# Patient Record
Sex: Male | Born: 1955 | Race: White | Hispanic: No | Marital: Married | Smoking: Never smoker
Health system: Southern US, Community
[De-identification: ages and names within clinical notes are randomized; demographics above are authoritative.]

## PROBLEM LIST (undated history)

## (undated) DIAGNOSIS — T7840XA Allergy, unspecified, initial encounter: Secondary | ICD-10-CM

## (undated) DIAGNOSIS — E119 Type 2 diabetes mellitus without complications: Secondary | ICD-10-CM

## (undated) DIAGNOSIS — K219 Gastro-esophageal reflux disease without esophagitis: Secondary | ICD-10-CM

## (undated) DIAGNOSIS — I4891 Unspecified atrial fibrillation: Secondary | ICD-10-CM

## (undated) DIAGNOSIS — I639 Cerebral infarction, unspecified: Secondary | ICD-10-CM

## (undated) HISTORY — DX: Allergy, unspecified, initial encounter: T78.40XA

## (undated) HISTORY — PX: KNEE SURGERY: SHX244

## (undated) HISTORY — PX: HAND SURGERY: SHX662

---

## 2019-12-07 ENCOUNTER — Emergency Department (HOSPITAL_COMMUNITY): Payer: No Typology Code available for payment source

## 2019-12-07 ENCOUNTER — Encounter (HOSPITAL_COMMUNITY): Payer: Self-pay

## 2019-12-07 ENCOUNTER — Inpatient Hospital Stay (HOSPITAL_COMMUNITY)
Admission: EM | Admit: 2019-12-07 | Discharge: 2019-12-16 | DRG: 459 | Disposition: A | Discharge status: Home Health | Payer: No Typology Code available for payment source | Attending: General Surgery | Admitting: General Surgery

## 2019-12-07 ENCOUNTER — Other Ambulatory Visit: Payer: Self-pay

## 2019-12-07 DIAGNOSIS — S42109A Fracture of unspecified part of scapula, unspecified shoulder, initial encounter for closed fracture: Secondary | ICD-10-CM | POA: Diagnosis present

## 2019-12-07 DIAGNOSIS — I513 Intracardiac thrombosis, not elsewhere classified: Secondary | ICD-10-CM | POA: Diagnosis present

## 2019-12-07 DIAGNOSIS — I712 Thoracic aortic aneurysm, without rupture, unspecified: Secondary | ICD-10-CM

## 2019-12-07 DIAGNOSIS — Z419 Encounter for procedure for purposes other than remedying health state, unspecified: Secondary | ICD-10-CM

## 2019-12-07 DIAGNOSIS — Z4659 Encounter for fitting and adjustment of other gastrointestinal appliance and device: Secondary | ICD-10-CM

## 2019-12-07 DIAGNOSIS — G4733 Obstructive sleep apnea (adult) (pediatric): Secondary | ICD-10-CM | POA: Diagnosis present

## 2019-12-07 DIAGNOSIS — S22069A Unspecified fracture of T7-T8 vertebra, initial encounter for closed fracture: Secondary | ICD-10-CM | POA: Diagnosis present

## 2019-12-07 DIAGNOSIS — Z79899 Other long term (current) drug therapy: Secondary | ICD-10-CM | POA: Diagnosis not present

## 2019-12-07 DIAGNOSIS — J90 Pleural effusion, not elsewhere classified: Secondary | ICD-10-CM | POA: Diagnosis present

## 2019-12-07 DIAGNOSIS — Z20822 Contact with and (suspected) exposure to covid-19: Secondary | ICD-10-CM | POA: Diagnosis present

## 2019-12-07 DIAGNOSIS — Y92410 Unspecified street and highway as the place of occurrence of the external cause: Secondary | ICD-10-CM

## 2019-12-07 DIAGNOSIS — S2241XA Multiple fractures of ribs, right side, initial encounter for closed fracture: Secondary | ICD-10-CM | POA: Diagnosis present

## 2019-12-07 DIAGNOSIS — R52 Pain, unspecified: Secondary | ICD-10-CM

## 2019-12-07 DIAGNOSIS — Z8673 Personal history of transient ischemic attack (TIA), and cerebral infarction without residual deficits: Secondary | ICD-10-CM | POA: Diagnosis not present

## 2019-12-07 DIAGNOSIS — I482 Chronic atrial fibrillation, unspecified: Secondary | ICD-10-CM | POA: Diagnosis present

## 2019-12-07 DIAGNOSIS — S27321A Contusion of lung, unilateral, initial encounter: Secondary | ICD-10-CM | POA: Diagnosis present

## 2019-12-07 DIAGNOSIS — I10 Essential (primary) hypertension: Secondary | ICD-10-CM | POA: Diagnosis present

## 2019-12-07 DIAGNOSIS — J9601 Acute respiratory failure with hypoxia: Secondary | ICD-10-CM | POA: Diagnosis present

## 2019-12-07 DIAGNOSIS — S27321D Contusion of lung, unilateral, subsequent encounter: Secondary | ICD-10-CM | POA: Diagnosis not present

## 2019-12-07 DIAGNOSIS — S22059A Unspecified fracture of T5-T6 vertebra, initial encounter for closed fracture: Principal | ICD-10-CM | POA: Diagnosis present

## 2019-12-07 DIAGNOSIS — S0990XA Unspecified injury of head, initial encounter: Secondary | ICD-10-CM

## 2019-12-07 DIAGNOSIS — K219 Gastro-esophageal reflux disease without esophagitis: Secondary | ICD-10-CM | POA: Diagnosis present

## 2019-12-07 DIAGNOSIS — S272XXA Traumatic hemopneumothorax, initial encounter: Secondary | ICD-10-CM

## 2019-12-07 DIAGNOSIS — R000 Tachycardia, unspecified: Secondary | ICD-10-CM | POA: Diagnosis not present

## 2019-12-07 DIAGNOSIS — Z7984 Long term (current) use of oral hypoglycemic drugs: Secondary | ICD-10-CM

## 2019-12-07 DIAGNOSIS — S8001XA Contusion of right knee, initial encounter: Secondary | ICD-10-CM | POA: Diagnosis present

## 2019-12-07 DIAGNOSIS — S42111A Displaced fracture of body of scapula, right shoulder, initial encounter for closed fracture: Secondary | ICD-10-CM | POA: Diagnosis present

## 2019-12-07 DIAGNOSIS — S42101A Fracture of unspecified part of scapula, right shoulder, initial encounter for closed fracture: Secondary | ICD-10-CM | POA: Diagnosis not present

## 2019-12-07 DIAGNOSIS — E1165 Type 2 diabetes mellitus with hyperglycemia: Secondary | ICD-10-CM | POA: Diagnosis present

## 2019-12-07 DIAGNOSIS — S270XXA Traumatic pneumothorax, initial encounter: Secondary | ICD-10-CM | POA: Diagnosis present

## 2019-12-07 DIAGNOSIS — K567 Ileus, unspecified: Secondary | ICD-10-CM | POA: Diagnosis not present

## 2019-12-07 DIAGNOSIS — D62 Acute posthemorrhagic anemia: Secondary | ICD-10-CM | POA: Diagnosis not present

## 2019-12-07 DIAGNOSIS — S161XXA Strain of muscle, fascia and tendon at neck level, initial encounter: Secondary | ICD-10-CM

## 2019-12-07 DIAGNOSIS — E1151 Type 2 diabetes mellitus with diabetic peripheral angiopathy without gangrene: Secondary | ICD-10-CM | POA: Diagnosis present

## 2019-12-07 DIAGNOSIS — Z7901 Long term (current) use of anticoagulants: Secondary | ICD-10-CM

## 2019-12-07 DIAGNOSIS — M25511 Pain in right shoulder: Secondary | ICD-10-CM | POA: Diagnosis not present

## 2019-12-07 DIAGNOSIS — J9690 Respiratory failure, unspecified, unspecified whether with hypoxia or hypercapnia: Secondary | ICD-10-CM

## 2019-12-07 DIAGNOSIS — I4891 Unspecified atrial fibrillation: Secondary | ICD-10-CM

## 2019-12-07 DIAGNOSIS — I779 Disorder of arteries and arterioles, unspecified: Secondary | ICD-10-CM

## 2019-12-07 DIAGNOSIS — R079 Chest pain, unspecified: Secondary | ICD-10-CM | POA: Diagnosis not present

## 2019-12-07 HISTORY — DX: Unspecified atrial fibrillation: I48.91

## 2019-12-07 HISTORY — DX: Cerebral infarction, unspecified: I63.9

## 2019-12-07 HISTORY — DX: Allergy, unspecified, initial encounter: T78.40XA

## 2019-12-07 HISTORY — DX: Gastro-esophageal reflux disease without esophagitis: K21.9

## 2019-12-07 HISTORY — DX: Type 2 diabetes mellitus without complications: E11.9

## 2019-12-07 LAB — CBC WITH DIFFERENTIAL/PLATELET
Abs Immature Granulocytes: 0.29 10*3/uL — ABNORMAL HIGH (ref 0.00–0.07)
Basophils Absolute: 0 10*3/uL (ref 0.0–0.1)
Basophils Relative: 0 %
Eosinophils Absolute: 0.1 10*3/uL (ref 0.0–0.5)
Eosinophils Relative: 1 %
HCT: 39.9 % (ref 39.0–52.0)
Hemoglobin: 12.8 g/dL — ABNORMAL LOW (ref 13.0–17.0)
Immature Granulocytes: 2 %
Lymphocytes Relative: 18 %
Lymphs Abs: 3.1 10*3/uL (ref 0.7–4.0)
MCH: 29.6 pg (ref 26.0–34.0)
MCHC: 32.1 g/dL (ref 30.0–36.0)
MCV: 92.1 fL (ref 80.0–100.0)
Monocytes Absolute: 1 10*3/uL (ref 0.1–1.0)
Monocytes Relative: 5 %
Neutro Abs: 13.1 10*3/uL — ABNORMAL HIGH (ref 1.7–7.7)
Neutrophils Relative %: 74 %
Platelets: 333 10*3/uL (ref 150–400)
RBC: 4.33 MIL/uL (ref 4.22–5.81)
RDW: 14.5 % (ref 11.5–15.5)
WBC: 17.6 10*3/uL — ABNORMAL HIGH (ref 4.0–10.5)
nRBC: 0 % (ref 0.0–0.2)

## 2019-12-07 LAB — COMPREHENSIVE METABOLIC PANEL
ALT: 65 U/L — ABNORMAL HIGH (ref 0–44)
AST: 48 U/L — ABNORMAL HIGH (ref 15–41)
Albumin: 3.6 g/dL (ref 3.5–5.0)
Alkaline Phosphatase: 80 U/L (ref 38–126)
Anion gap: 11 (ref 5–15)
BUN: 21 mg/dL (ref 8–23)
CO2: 23 mmol/L (ref 22–32)
Calcium: 9 mg/dL (ref 8.9–10.3)
Chloride: 104 mmol/L (ref 98–111)
Creatinine, Ser: 1.06 mg/dL (ref 0.61–1.24)
GFR calc Af Amer: >60 mL/min (ref >60)
GFR calc non Af Amer: >60 mL/min (ref >60)
Glucose, Bld: 196 mg/dL — ABNORMAL HIGH (ref 70–99)
Potassium: 4 mmol/L (ref 3.5–5.1)
Sodium: 138 mmol/L (ref 135–145)
Total Bilirubin: 1.2 mg/dL (ref 0.3–1.2)
Total Protein: 6.8 g/dL (ref 6.5–8.1)

## 2019-12-07 LAB — ABO/RH: ABO/RH(D): O POS

## 2019-12-07 LAB — I-STAT CHEM 8, ED
BUN: 23 mg/dL (ref 8–23)
Calcium, Ion: 1.07 mmol/L — ABNORMAL LOW (ref 1.15–1.40)
Chloride: 103 mmol/L (ref 98–111)
Creatinine, Ser: 1 mg/dL (ref 0.61–1.24)
Glucose, Bld: 190 mg/dL — ABNORMAL HIGH (ref 70–99)
HCT: 40 % (ref 39.0–52.0)
Hemoglobin: 13.6 g/dL (ref 13.0–17.0)
Potassium: 4 mmol/L (ref 3.5–5.1)
Sodium: 141 mmol/L (ref 135–145)
TCO2: 24 mmol/L (ref 22–32)

## 2019-12-07 LAB — GLUCOSE, CAPILLARY: Glucose-Capillary: 203 mg/dL — ABNORMAL HIGH (ref 70–99)

## 2019-12-07 LAB — TYPE AND SCREEN
ABO/RH(D): O POS
Antibody Screen: NEGATIVE

## 2019-12-07 LAB — HIV ANTIBODY (ROUTINE TESTING W REFLEX): HIV Screen 4th Generation wRfx: NONREACTIVE

## 2019-12-07 LAB — MRSA PCR SCREENING: MRSA by PCR: NEGATIVE

## 2019-12-07 MED ORDER — INSULIN ASPART 100 UNIT/ML ~~LOC~~ SOLN
0.0000 [IU] | Freq: Every day | SUBCUTANEOUS | Status: DC
  Administered 2019-12-07: 2 [IU] via SUBCUTANEOUS

## 2019-12-07 MED ORDER — ONDANSETRON HCL 4 MG/2ML IJ SOLN
4.0000 mg | Freq: Once | INTRAMUSCULAR | Status: AC
  Administered 2019-12-07: 4 mg via INTRAVENOUS
  Filled 2019-12-07: qty 2

## 2019-12-07 MED ORDER — ENOXAPARIN SODIUM 40 MG/0.4ML ~~LOC~~ SOLN: 40.0000 mg | Freq: Two times a day (BID) | SUBCUTANEOUS | Status: DC

## 2019-12-07 MED ORDER — INSULIN ASPART 100 UNIT/ML ~~LOC~~ SOLN
0.0000 [IU] | Freq: Three times a day (TID) | SUBCUTANEOUS | Status: DC
  Administered 2019-12-08 (×3): 2 [IU] via SUBCUTANEOUS
  Administered 2019-12-09: 3 [IU] via SUBCUTANEOUS
  Administered 2019-12-09 – 2019-12-13 (×8): 2 [IU] via SUBCUTANEOUS
  Administered 2019-12-13: 3 [IU] via SUBCUTANEOUS
  Administered 2019-12-13 – 2019-12-14 (×2): 2 [IU] via SUBCUTANEOUS
  Administered 2019-12-14: 3 [IU] via SUBCUTANEOUS
  Administered 2019-12-14 – 2019-12-16 (×6): 2 [IU] via SUBCUTANEOUS

## 2019-12-07 MED ORDER — LACTATED RINGERS IV SOLN: INTRAVENOUS | Status: DC

## 2019-12-07 MED ORDER — DOCUSATE SODIUM 100 MG PO CAPS
100.0000 mg | ORAL_CAPSULE | Freq: Two times a day (BID) | ORAL | Status: DC
  Administered 2019-12-07 – 2019-12-16 (×18): 100 mg via ORAL
  Filled 2019-12-07 (×18): qty 1

## 2019-12-07 MED ORDER — METHOCARBAMOL 1000 MG/10ML IJ SOLN
1000.0000 mg | Freq: Three times a day (TID) | INTRAVENOUS | Status: DC
  Administered 2019-12-07 – 2019-12-16 (×25): 1000 mg via INTRAVENOUS
  Filled 2019-12-07 (×36): qty 10

## 2019-12-07 MED ORDER — OXYCODONE HCL 5 MG PO TABS
5.0000 mg | ORAL_TABLET | ORAL | Status: DC | PRN
  Administered 2019-12-07 – 2019-12-10 (×14): 10 mg via ORAL
  Administered 2019-12-10: 5 mg via ORAL
  Administered 2019-12-10 – 2019-12-12 (×5): 10 mg via ORAL
  Administered 2019-12-13: 5 mg via ORAL
  Administered 2019-12-13 – 2019-12-16 (×4): 10 mg via ORAL
  Filled 2019-12-07: qty 1
  Filled 2019-12-07: qty 2
  Filled 2019-12-07: qty 1
  Filled 2019-12-07 (×7): qty 2
  Filled 2019-12-07: qty 1
  Filled 2019-12-07 (×9): qty 2
  Filled 2019-12-07: qty 1
  Filled 2019-12-07 (×4): qty 2
  Filled 2019-12-07: qty 1
  Filled 2019-12-07 (×2): qty 2

## 2019-12-07 MED ORDER — ESMOLOL HCL-SODIUM CHLORIDE 2000 MG/100ML IV SOLN
25.0000 ug/kg/min | INTRAVENOUS | Status: DC
  Administered 2019-12-07 – 2019-12-08 (×2): 25 ug/kg/min via INTRAVENOUS
  Filled 2019-12-07 (×2): qty 100

## 2019-12-07 MED ORDER — ACETAMINOPHEN 500 MG PO TABS
1000.0000 mg | ORAL_TABLET | Freq: Four times a day (QID) | ORAL | Status: DC
  Administered 2019-12-07 – 2019-12-16 (×30): 1000 mg via ORAL
  Filled 2019-12-07 (×32): qty 2

## 2019-12-07 MED ORDER — IOHEXOL 300 MG/ML  SOLN
100.0000 mL | Freq: Once | INTRAMUSCULAR | Status: AC | PRN
  Administered 2019-12-07: 100 mL via INTRAVENOUS

## 2019-12-07 MED ORDER — SODIUM CHLORIDE 0.9 % IV BOLUS
1000.0000 mL | Freq: Once | INTRAVENOUS | Status: AC
  Administered 2019-12-07: 1000 mL via INTRAVENOUS

## 2019-12-07 MED ORDER — MORPHINE SULFATE (PF) 4 MG/ML IV SOLN
4.0000 mg | INTRAVENOUS | Status: DC | PRN
  Administered 2019-12-07 – 2019-12-11 (×10): 4 mg via INTRAVENOUS
  Filled 2019-12-07 (×11): qty 1

## 2019-12-07 MED ORDER — MORPHINE SULFATE (PF) 4 MG/ML IV SOLN
4.0000 mg | Freq: Once | INTRAVENOUS | Status: AC
  Administered 2019-12-07: 4 mg via INTRAVENOUS
  Filled 2019-12-07: qty 1

## 2019-12-07 MED ORDER — ONDANSETRON HCL 4 MG/2ML IJ SOLN
4.0000 mg | Freq: Four times a day (QID) | INTRAMUSCULAR | Status: DC | PRN
  Administered 2019-12-12 (×2): 4 mg via INTRAVENOUS
  Filled 2019-12-07 (×2): qty 2

## 2019-12-07 MED ORDER — ONDANSETRON 4 MG PO TBDP
4.0000 mg | ORAL_TABLET | Freq: Four times a day (QID) | ORAL | Status: DC | PRN
  Filled 2019-12-07: qty 1
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
## 2019-12-08 ENCOUNTER — Inpatient Hospital Stay (HOSPITAL_COMMUNITY): Payer: No Typology Code available for payment source

## 2019-12-08 ENCOUNTER — Telehealth: Payer: Self-pay

## 2019-12-08 ENCOUNTER — Encounter (HOSPITAL_COMMUNITY): Payer: Self-pay

## 2019-12-08 DIAGNOSIS — E1151 Type 2 diabetes mellitus with diabetic peripheral angiopathy without gangrene: Secondary | ICD-10-CM

## 2019-12-08 DIAGNOSIS — I779 Disorder of arteries and arterioles, unspecified: Secondary | ICD-10-CM

## 2019-12-08 DIAGNOSIS — J9601 Acute respiratory failure with hypoxia: Secondary | ICD-10-CM

## 2019-12-08 LAB — BASIC METABOLIC PANEL
Anion gap: 11 (ref 5–15)
BUN: 22 mg/dL (ref 8–23)
CO2: 22 mmol/L (ref 22–32)
Calcium: 9 mg/dL (ref 8.9–10.3)
Chloride: 104 mmol/L (ref 98–111)
Creatinine, Ser: 0.92 mg/dL (ref 0.61–1.24)
GFR calc Af Amer: >60 mL/min (ref >60)
GFR calc non Af Amer: >60 mL/min (ref >60)
Glucose, Bld: 175 mg/dL — ABNORMAL HIGH (ref 70–99)
Potassium: 4.7 mmol/L (ref 3.5–5.1)
Sodium: 137 mmol/L (ref 135–145)

## 2019-12-08 LAB — SARS CORONAVIRUS 2 BY RT PCR (HOSPITAL ORDER, PERFORMED IN ~~LOC~~ HOSPITAL LAB): SARS Coronavirus 2: NEGATIVE

## 2019-12-08 LAB — GLUCOSE, CAPILLARY
Glucose-Capillary: 164 mg/dL — ABNORMAL HIGH (ref 70–99)
Glucose-Capillary: 140 mg/dL — ABNORMAL HIGH (ref 70–99)
Glucose-Capillary: 122 mg/dL — ABNORMAL HIGH (ref 70–99)
Glucose-Capillary: 124 mg/dL — ABNORMAL HIGH (ref 70–99)
Glucose-Capillary: 131 mg/dL — ABNORMAL HIGH (ref 70–99)

## 2019-12-08 LAB — CBC
HCT: 34.7 % — ABNORMAL LOW (ref 39.0–52.0)
Hemoglobin: 11 g/dL — ABNORMAL LOW (ref 13.0–17.0)
MCH: 29.4 pg (ref 26.0–34.0)
MCHC: 31.7 g/dL (ref 30.0–36.0)
MCV: 92.8 fL (ref 80.0–100.0)
Platelets: 278 10*3/uL (ref 150–400)
RBC: 3.74 MIL/uL — ABNORMAL LOW (ref 4.22–5.81)
RDW: 14.8 % (ref 11.5–15.5)
WBC: 17.6 10*3/uL — ABNORMAL HIGH (ref 4.0–10.5)
nRBC: 0 % (ref 0.0–0.2)

## 2019-12-08 MED ORDER — ATORVASTATIN CALCIUM 80 MG PO TABS
80.0000 mg | ORAL_TABLET | Freq: Every day | ORAL | Status: DC
  Administered 2019-12-08 – 2019-12-16 (×9): 80 mg via ORAL
  Filled 2019-12-08 (×9): qty 1

## 2019-12-08 MED ORDER — IOHEXOL 350 MG/ML SOLN
100.0000 mL | Freq: Once | INTRAVENOUS | Status: AC | PRN
  Administered 2019-12-08: 100 mL via INTRAVENOUS

## 2019-12-08 MED ORDER — DILTIAZEM HCL ER COATED BEADS 180 MG PO CP24
180.0000 mg | ORAL_CAPSULE | Freq: Every day | ORAL | Status: DC
  Administered 2019-12-08 – 2019-12-15 (×8): 180 mg via ORAL
  Filled 2019-12-08 (×8): qty 1

## 2019-12-08 MED ORDER — PANTOPRAZOLE SODIUM 40 MG PO TBEC
40.0000 mg | DELAYED_RELEASE_TABLET | Freq: Every day | ORAL | Status: DC
  Administered 2019-12-08 – 2019-12-12 (×5): 40 mg via ORAL
  Filled 2019-12-08 (×5): qty 1

## 2019-12-08 MED ORDER — ENOXAPARIN SODIUM 40 MG/0.4ML ~~LOC~~ SOLN
40.0000 mg | Freq: Two times a day (BID) | SUBCUTANEOUS | Status: DC
  Administered 2019-12-08 – 2019-12-12 (×9): 40 mg via SUBCUTANEOUS
  Filled 2019-12-08 (×9): qty 0.4

## 2019-12-08 MED ORDER — LIDOCAINE 5 % EX PTCH
1.0000 | MEDICATED_PATCH | CUTANEOUS | Status: DC
  Administered 2019-12-08 – 2019-12-16 (×7): 1 via TRANSDERMAL
  Filled 2019-12-08 (×9): qty 1

## 2019-12-08 MED ORDER — CHLORHEXIDINE GLUCONATE CLOTH 2 % EX PADS
6.0000 | MEDICATED_PAD | Freq: Every day | CUTANEOUS | Status: DC
  Administered 2019-12-08 – 2019-12-15 (×6): 6 via TOPICAL

## 2019-12-08 MED ORDER — LORATADINE 10 MG PO TABS
10.0000 mg | ORAL_TABLET | Freq: Every day | ORAL | Status: DC
  Administered 2019-12-08 – 2019-12-16 (×9): 10 mg via ORAL
  Filled 2019-12-08 (×9): qty 1

## 2019-12-08 MED ORDER — METOPROLOL SUCCINATE ER 50 MG PO TB24
50.0000 mg | ORAL_TABLET | Freq: Every day | ORAL | Status: DC
  Administered 2019-12-08 – 2019-12-15 (×8): 50 mg via ORAL
  Filled 2019-12-08 (×8): qty 1

## 2019-12-08 NOTE — Progress Notes (Signed)
PT Cancellation Note  Patient Details Name: Mark Molina MRN: [id number] DOB: 10/15/1955   Cancelled Treatment:    Reason Eval/Treat Not Completed: Patient not medically ready, RN reports noted spinal fracture on CT this am and plan for bedrest until surgery likely Monday.  PT to sign off and wait new orders when stable.   Elray Mcgregor 12/08/2019, 10:06 AM  Sheran Lawless, PT Acute Rehabilitation Services [phone] Office:[phone] 12/08/2019
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
## 2019-12-08 NOTE — Progress Notes (Signed)
OT Cancellation Note  Patient Details Name: Mark Molina MRN: [id number] DOB: Nov 24, 1955   Cancelled Treatment:    Reason Eval/Treat Not Completed: Medical issues which prohibited therapy. Pt with newly diagnosed spinal fx, bed rest until Monday. Will sign off and await new order post sx.  Evern Bio 12/08/2019, 10:19 AM  Martie Round, OTR/L Acute Rehabilitation Services Pager: [phone] Office: [phone]
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
## 2019-12-08 NOTE — Progress Notes (Signed)
CT scan right shoulder reviewed.  Not too much shortening of the scapula.  Fracture predominantly extra-articular.  Joint surface remains reasonably aligned.  Continue with nonoperative care with sling use when he is out of bed.  Note made of plans for surgery on Monday.  For that posterior surgery he should be okay to be carefully positioned in the abducted externally rotated position necessary for prone thoracic spine surgery.  He will need repeat radiographs in 10 days.

## 2019-12-08 NOTE — Telephone Encounter (Signed)
error 
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
## 2019-12-09 DIAGNOSIS — I712 Thoracic aortic aneurysm, without rupture: Secondary | ICD-10-CM

## 2019-12-09 DIAGNOSIS — S27321D Contusion of lung, unilateral, subsequent encounter: Secondary | ICD-10-CM

## 2019-12-09 LAB — CBC
HCT: 32.4 % — ABNORMAL LOW (ref 39.0–52.0)
Hemoglobin: 10.3 g/dL — ABNORMAL LOW (ref 13.0–17.0)
MCH: 29.7 pg (ref 26.0–34.0)
MCHC: 31.8 g/dL (ref 30.0–36.0)
MCV: 93.4 fL (ref 80.0–100.0)
Platelets: 263 10*3/uL (ref 150–400)
RBC: 3.47 MIL/uL — ABNORMAL LOW (ref 4.22–5.81)
RDW: 15.1 % (ref 11.5–15.5)
WBC: 15.5 10*3/uL — ABNORMAL HIGH (ref 4.0–10.5)
nRBC: 0 % (ref 0.0–0.2)

## 2019-12-09 LAB — GLUCOSE, CAPILLARY
Glucose-Capillary: 127 mg/dL — ABNORMAL HIGH (ref 70–99)
Glucose-Capillary: 151 mg/dL — ABNORMAL HIGH (ref 70–99)
Glucose-Capillary: 117 mg/dL — ABNORMAL HIGH (ref 70–99)
Glucose-Capillary: 146 mg/dL — ABNORMAL HIGH (ref 70–99)

## 2019-12-09 LAB — BASIC METABOLIC PANEL
Anion gap: 7 (ref 5–15)
BUN: 18 mg/dL (ref 8–23)
CO2: 27 mmol/L (ref 22–32)
Calcium: 8.8 mg/dL — ABNORMAL LOW (ref 8.9–10.3)
Chloride: 102 mmol/L (ref 98–111)
Creatinine, Ser: 0.88 mg/dL (ref 0.61–1.24)
GFR calc Af Amer: >60 mL/min (ref >60)
GFR calc non Af Amer: >60 mL/min (ref >60)
Glucose, Bld: 142 mg/dL — ABNORMAL HIGH (ref 70–99)
Potassium: 4.3 mmol/L (ref 3.5–5.1)
Sodium: 136 mmol/L (ref 135–145)
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
## 2019-12-09 NOTE — Progress Notes (Signed)
Patient arrived to room 4NP05 from [facility]. Moved patient into bed, patient is in pain with the movement, no pain meds available yet. Positioned patient in bed and he is feeling some better. VSS. Will continue to monitor.

## 2019-12-09 NOTE — TOC CAGE-AID Note (Signed)
Transition of Care [facility]) - CAGE-AID Screening   Patient Details  Name: Mark Molina MRN: [id number] Date of Birth: January 25, 1956  Transition of Care [facility]) CM/SW Contact:    Emeterio Reeve, [place] Phone Number: 12/09/2019, 1:23 PM   Clinical Narrative:  CSW met with pt at bedside. CSW introduced self and explained her role at the hospital. Pt denied alcohol use and substance use. No resources are needed at this time.   CAGE-AID Screening:    Have You Ever Felt You Ought to Cut Down on Your Drinking or Drug Use?: No Have People Annoyed You By Critizing Your Drinking Or Drug Use?: No Have You Felt Bad Or Guilty About Your Drinking Or Drug Use?: No Have You Ever Had a Drink or Used Drugs First Thing In The Morning to STeady Your Nerves or to Get Rid of a Hangover?: No CAGE-AID Score: 0  Substance Abuse Education Offered: Yes

## 2019-12-10 ENCOUNTER — Inpatient Hospital Stay (HOSPITAL_COMMUNITY): Payer: No Typology Code available for payment source

## 2019-12-10 LAB — CBC
HCT: 30 % — ABNORMAL LOW (ref 39.0–52.0)
Hemoglobin: 9.8 g/dL — ABNORMAL LOW (ref 13.0–17.0)
MCH: 29.9 pg (ref 26.0–34.0)
MCHC: 32.7 g/dL (ref 30.0–36.0)
MCV: 91.5 fL (ref 80.0–100.0)
Platelets: 240 10*3/uL (ref 150–400)
RBC: 3.28 MIL/uL — ABNORMAL LOW (ref 4.22–5.81)
RDW: 15 % (ref 11.5–15.5)
WBC: 15.4 10*3/uL — ABNORMAL HIGH (ref 4.0–10.5)
nRBC: 0 % (ref 0.0–0.2)

## 2019-12-10 LAB — BASIC METABOLIC PANEL
Anion gap: 9 (ref 5–15)
BUN: 13 mg/dL (ref 8–23)
CO2: 25 mmol/L (ref 22–32)
Calcium: 8.4 mg/dL — ABNORMAL LOW (ref 8.9–10.3)
Chloride: 99 mmol/L (ref 98–111)
Creatinine, Ser: 0.84 mg/dL (ref 0.61–1.24)
GFR calc Af Amer: >60 mL/min (ref >60)
GFR calc non Af Amer: >60 mL/min (ref >60)
Glucose, Bld: 141 mg/dL — ABNORMAL HIGH (ref 70–99)
Potassium: 4.1 mmol/L (ref 3.5–5.1)
Sodium: 133 mmol/L — ABNORMAL LOW (ref 135–145)

## 2019-12-10 LAB — GLUCOSE, CAPILLARY
Glucose-Capillary: 148 mg/dL — ABNORMAL HIGH (ref 70–99)
Glucose-Capillary: 140 mg/dL — ABNORMAL HIGH (ref 70–99)
Glucose-Capillary: 134 mg/dL — ABNORMAL HIGH (ref 70–99)
Glucose-Capillary: 101 mg/dL — ABNORMAL HIGH (ref 70–99)

## 2019-12-10 MED ORDER — DEXTROSE 5 % IV SOLN
3.0000 g | INTRAVENOUS | Status: AC
  Administered 2019-12-11: 3 g via INTRAVENOUS
  Filled 2019-12-10 (×2): qty 3000

## 2019-12-10 MED ORDER — GUAIFENESIN 100 MG/5ML PO SOLN
10.0000 mL | ORAL | Status: AC
  Administered 2019-12-10 – 2019-12-13 (×10): 200 mg via ORAL
  Filled 2019-12-10 (×3): qty 10
  Filled 2019-12-10: qty 20
  Filled 2019-12-10: qty 10
  Filled 2019-12-10 (×2): qty 15
  Filled 2019-12-10 (×4): qty 10

## 2019-12-10 MED ORDER — CEFAZOLIN SODIUM-DEXTROSE 1-4 GM/50ML-% IV SOLN
1.0000 g | INTRAVENOUS | Status: DC
  Filled 2019-12-10: qty 50

## 2019-12-10 MED ORDER — KETOROLAC TROMETHAMINE 15 MG/ML IJ SOLN
15.0000 mg | Freq: Four times a day (QID) | INTRAMUSCULAR | Status: AC
  Administered 2019-12-10 – 2019-12-15 (×18): 15 mg via INTRAVENOUS
  Filled 2019-12-10 (×18): qty 1

## 2019-12-10 MED ORDER — IOHEXOL 350 MG/ML SOLN
80.0000 mL | Freq: Once | INTRAVENOUS | Status: AC | PRN
  Administered 2019-12-10: 80 mL via INTRAVENOUS

## 2019-12-10 MED ORDER — IPRATROPIUM-ALBUTEROL 0.5-2.5 (3) MG/3ML IN SOLN
3.0000 mL | RESPIRATORY_TRACT | Status: AC
  Administered 2019-12-10 (×4): 3 mL via RESPIRATORY_TRACT
  Filled 2019-12-10 (×4): qty 3

## 2019-12-10 NOTE — Progress Notes (Signed)
Subjective: Patient reports some respiratory congestion due to pain in his thoracic spine with coughing. He has no other complaints.   Objective: Vital signs in last 24 hours: Temp:  [97.6 F (36.4 C)-99.6 F (37.6 C)] 97.6 F (36.4 C) (07/10 0757) Pulse Rate:  [83-98] 90 (07/10 0757) Resp:  [15-22] 16 (07/10 0757) BP: (114-150)/(58-78) 119/65 (07/10 0757) SpO2:  [92 %-96 %] 96 % (07/10 0400)  Intake/Output from previous day: 07/09 0701 - 07/10 0700 In: 481.5 [P.O.:120; I.V.:261.7; IV Piggyback:99.8] Out: 725 [Urine:725] Intake/Output this shift: Total I/O In: -  Out: 350 [Urine:350]  Physical Exam: Patient is awake, conversant, alert, oriented.  Speech is fluent and appropriate CN grossly intact  5/5 BLE    Lab Results: Recent Labs    12/09/19 0231 12/10/19 0248  WBC 15.5* 15.4*  HGB 10.3* 9.8*  HCT 32.4* 30.0*  PLT 263 240   BMET Recent Labs    12/09/19 0231 12/10/19 0248  NA 136 133*  K 4.3 4.1  CL 102 99  CO2 27 25  GLUCOSE 142* 141*  BUN 18 13  CREATININE 0.88 0.84  CALCIUM 8.8* 8.4*    Studies/Results: No results found.  Assessment/Plan: Continue pulmonary toileting. He is neurologically stable. Plan for T6-9 stabilization Monday. No new neurosurgical recommendations at this time.    LOS: 3 days    Dorian Heckle, MD 12/10/2019, 10:46 AM

## 2019-12-10 NOTE — Progress Notes (Signed)
Pt states he does not want to wear cpap tonight.  RT will continue to monitor.
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
## 2019-12-10 NOTE — Progress Notes (Signed)
I have reviewed the patient's CT scan from earlier today.  This appears to show interval progression of the intramural hematoma.  I discussed these findings with the patient as well as his wife.  At this point, I feel he has failed nonoperative management and we need to proceed with endovascular repair.  This will require coverage from his left subclavian down to the celiac artery.  I discussed the details of the procedure as well as the risks and benefits including but not limited to the risk of bleeding as well as paralysis.  This procedure has been scheduled for 12/11/2019. ° °Wells Edda Orea °

## 2019-12-10 NOTE — H&P (View-Only) (Signed)
I have reviewed the patient's CT scan from earlier today.  This appears to show interval progression of the intramural hematoma.  I discussed these findings with the patient as well as his wife.  At this point, I feel he has failed nonoperative management and we need to proceed with endovascular repair.  This will require coverage from his left subclavian down to the celiac artery.  I discussed the details of the procedure as well as the risks and benefits including but not limited to the risk of bleeding as well as paralysis.  This procedure has been scheduled for 12/11/2019.  Durene Cal

## 2019-12-10 NOTE — Progress Notes (Signed)
Pt self administers flutter at this time

## 2019-12-10 NOTE — Progress Notes (Signed)
    Subjective  -   C/o back pain Wants a new bed like th one he had in the ICU  Physical Exam:  Palpable pedal pulses abd soft   Assessment/Plan:   Will order repeat CTA for today Continue to hold Eliquis Spoke with nursing about getting a new bed  Wells Francesa Eugenio 12/10/2019 6:58 AM --  Vitals:   12/09/19 2318 12/10/19 0400  BP: 131/63 (!) 114/58  Pulse: 97 83  Resp: 20 15  Temp: 99.6 F (37.6 C) 98.5 F (36.9 C)  SpO2: 96% 96%    Intake/Output Summary (Last 24 hours) at 12/10/2019 0658 Last data filed at 12/09/2019 1726 Gross per 24 hour  Intake 531.29 ml  Output 725 ml  Net -193.71 ml     Laboratory CBC    Component Value Date/Time   WBC 15.4 (H) 12/10/2019 0248   HGB 9.8 (L) 12/10/2019 0248   HCT 30.0 (L) 12/10/2019 0248   PLT 240 12/10/2019 0248    BMET    Component Value Date/Time   NA 133 (L) 12/10/2019 0248   K 4.1 12/10/2019 0248   CL 99 12/10/2019 0248   CO2 25 12/10/2019 0248   GLUCOSE 141 (H) 12/10/2019 0248   BUN 13 12/10/2019 0248   CREATININE 0.84 12/10/2019 0248   CALCIUM 8.4 (L) 12/10/2019 0248   GFRNONAA >60 12/10/2019 0248   GFRAA >60 12/10/2019 0248    COAG No results found for: INR, PROTIME No results found for: PTT  Antibiotics Anti-infectives (From admission, onward)   None       V. Charlena Cross, M.D., [facility] [place] Office: [phone] Pager:  [phone]

## 2019-12-10 NOTE — Progress Notes (Signed)
Trauma/Critical Care Follow Up Note  Subjective:    Overnight Issues: pain worse with coughing  Objective:  Vital signs for last 24 hours: Temp:  [97.6 F (36.4 C)-99.6 F (37.6 C)] 97.6 F (36.4 C) (07/10 0757) Pulse Rate:  [83-98] 90 (07/10 0757) Resp:  [15-22] 16 (07/10 0757) BP: (114-151)/(58-78) 119/65 (07/10 0757) SpO2:  [92 %-96 %] 96 % (07/10 0400)  Hemodynamic parameters for last 24 hours:    Intake/Output from previous day: 07/09 0701 - 07/10 0700 In: 481.5 [P.O.:120; I.V.:261.7; IV Piggyback:99.8] Out: 725 [Urine:725]  Intake/Output this shift: Total I/O In: -  Out: 350 [Urine:350]  Vent settings for last 24 hours:    Physical Exam:  Gen: comfortable, no distress Neuro: non-focal exam HEENT: PERRL Neck: supple CV: RRR Pulm: unlabored breathing Abd: soft, NT GU: clear yellow urine Extr: wwp, no edema   Results for orders placed or performed during the hospital encounter of 12/07/19 (from the past 24 hour(s))  Glucose, capillary     Status: Abnormal   Collection Time: 12/09/19 11:06 AM  Result Value Ref Range   Glucose-Capillary 151 (H) 70 - 99 mg/dL  Glucose, capillary     Status: Abnormal   Collection Time: 12/09/19  4:55 PM  Result Value Ref Range   Glucose-Capillary 117 (H) 70 - 99 mg/dL  Glucose, capillary     Status: Abnormal   Collection Time: 12/09/19 10:10 PM  Result Value Ref Range   Glucose-Capillary 146 (H) 70 - 99 mg/dL  CBC     Status: Abnormal   Collection Time: 12/10/19  2:48 AM  Result Value Ref Range   WBC 15.4 (H) 4.0 - 10.5 K/uL   RBC 3.28 (L) 4.22 - 5.81 MIL/uL   Hemoglobin 9.8 (L) 13.0 - 17.0 g/dL   HCT 63.3 (L) 39 - 52 %   MCV 91.5 80.0 - 100.0 fL   MCH 29.9 26.0 - 34.0 pg   MCHC 32.7 30.0 - 36.0 g/dL   RDW 35.4 56.2 - 56.3 %   Platelets 240 150 - 400 K/uL   nRBC 0.0 0.0 - 0.2 %  Basic metabolic panel     Status: Abnormal   Collection Time: 12/10/19  2:48 AM  Result Value Ref Range   Sodium 133 (L) 135 - 145  mmol/L   Potassium 4.1 3.5 - 5.1 mmol/L   Chloride 99 98 - 111 mmol/L   CO2 25 22 - 32 mmol/L   Glucose, Bld 141 (H) 70 - 99 mg/dL   BUN 13 8 - 23 mg/dL   Creatinine, Ser 8.93 0.61 - 1.24 mg/dL   Calcium 8.4 (L) 8.9 - 10.3 mg/dL   GFR calc non Af Amer >60 >60 mL/min   GFR calc Af Amer >60 >60 mL/min   Anion gap 9 5 - 15  Glucose, capillary     Status: Abnormal   Collection Time: 12/10/19  7:59 AM  Result Value Ref Range   Glucose-Capillary 148 (H) 70 - 99 mg/dL    Assessment & Plan:   Present on Admission:  Scapula fracture  Aorta disorder (HCC)  Closed traumatic fracture of ribs of right side with pneumothorax  Diabetes mellitus type 2 with peripheral artery disease (HCC)  OSA (obstructive sleep apnea)  Hematoma of right knee region    LOS: 3 days   Additional comments:I reviewed the patient's new clinical lab test results.   and I reviewed the patients new imaging test results.    [facility]  Multiple R  rib fxs with tiny R H/PNX - multimodal pain control and pulm toilet/IS, add flutter valve R pulm contusion - IS/pulm toilet, start chest PT and 24h of duonebs R scapula fx - Ortho c/s (Dr. August Saucer), likely non-operative, sling when OOB Aortic injury - VVS c/s (Dr. Myra Gianotti), stable on repeat CT 7/8, likely non-operative, repeat CT planned for 7/10 Unstable T7/8 fracture - NSGY c/s (Dr. Conchita Paris), to OR 7/12, bedrest Road rash - local wound care Recent stroke 08/2019- no residual weakness, on eliquis (holding) HTN - home meds restarted DM- SSI OSA - uses CPAP at home, resumed VTE -SCDs, lovenox FEN -reg diet Dispo -SDU  Diamantina Monks, MD Trauma & General Surgery Please use [email or web address] to contact on call provider  12/10/2019  *Care during the described time interval was provided by me. I have reviewed this patient's available data, including medical history, events of note, physical examination and test results as part of my evaluation.

## 2019-12-10 NOTE — Progress Notes (Signed)
Pt stable Reporting scapular pain and some pain with coughing Plan for shoulder sling when up Robitussin seems reasonable for cough - v.o. to nurse given

## 2019-12-11 ENCOUNTER — Inpatient Hospital Stay (HOSPITAL_COMMUNITY): Payer: No Typology Code available for payment source | Admitting: Anesthesiology

## 2019-12-11 ENCOUNTER — Encounter (HOSPITAL_COMMUNITY): Admission: EM | Disposition: A | Discharge status: Home Health | Payer: Self-pay | Source: Home / Self Care

## 2019-12-11 ENCOUNTER — Inpatient Hospital Stay (HOSPITAL_COMMUNITY): Payer: No Typology Code available for payment source

## 2019-12-11 ENCOUNTER — Encounter (HOSPITAL_COMMUNITY): Payer: Self-pay

## 2019-12-11 HISTORY — PX: ULTRASOUND GUIDANCE FOR VASCULAR ACCESS: SHX6516

## 2019-12-11 HISTORY — PX: THORACIC AORTIC ENDOVASCULAR STENT GRAFT: SHX6112

## 2019-12-11 LAB — GLUCOSE, CAPILLARY
Glucose-Capillary: 150 mg/dL — ABNORMAL HIGH (ref 70–99)
Glucose-Capillary: 140 mg/dL — ABNORMAL HIGH (ref 70–99)
Glucose-Capillary: 145 mg/dL — ABNORMAL HIGH (ref 70–99)
Glucose-Capillary: 128 mg/dL — ABNORMAL HIGH (ref 70–99)
Glucose-Capillary: 131 mg/dL — ABNORMAL HIGH (ref 70–99)

## 2019-12-11 LAB — BASIC METABOLIC PANEL
Anion gap: 9 (ref 5–15)
BUN: 19 mg/dL (ref 8–23)
CO2: 27 mmol/L (ref 22–32)
Calcium: 8.8 mg/dL — ABNORMAL LOW (ref 8.9–10.3)
Chloride: 101 mmol/L (ref 98–111)
Creatinine, Ser: 1.02 mg/dL (ref 0.61–1.24)
GFR calc Af Amer: >60 mL/min (ref >60)
GFR calc non Af Amer: >60 mL/min (ref >60)
Glucose, Bld: 147 mg/dL — ABNORMAL HIGH (ref 70–99)
Potassium: 4.2 mmol/L (ref 3.5–5.1)
Sodium: 137 mmol/L (ref 135–145)

## 2019-12-11 LAB — CBC
HCT: 28.7 % — ABNORMAL LOW (ref 39.0–52.0)
Hemoglobin: 9.1 g/dL — ABNORMAL LOW (ref 13.0–17.0)
MCH: 29 pg (ref 26.0–34.0)
MCHC: 31.7 g/dL (ref 30.0–36.0)
MCV: 91.4 fL (ref 80.0–100.0)
Platelets: 262 10*3/uL (ref 150–400)
RBC: 3.14 MIL/uL — ABNORMAL LOW (ref 4.22–5.81)
RDW: 14.8 % (ref 11.5–15.5)
WBC: 13.5 10*3/uL — ABNORMAL HIGH (ref 4.0–10.5)
nRBC: 0 % (ref 0.0–0.2)

## 2019-12-11 LAB — PROTIME-INR
INR: 1.2 (ref 0.8–1.2)
Prothrombin Time: 14.7 seconds (ref 11.4–15.2)

## 2019-12-11 SURGERY — INSERTION, ENDOVASCULAR STENT GRAFT, AORTA, THORACIC
Anesthesia: General | Site: Thoracic

## 2019-12-11 MED ORDER — LABETALOL HCL 5 MG/ML IV SOLN
INTRAVENOUS | Status: DC | PRN
Start: 2019-12-11 — End: 2019-12-11
  Administered 2019-12-11 (×3): 5 mg via INTRAVENOUS

## 2019-12-11 MED ORDER — ALUM & MAG HYDROXIDE-SIMETH 200-200-20 MG/5ML PO SUSP
15.0000 mL | ORAL | Status: DC | PRN
  Administered 2019-12-11 – 2019-12-12 (×2): 30 mL via ORAL
  Filled 2019-12-11 (×2): qty 30

## 2019-12-11 MED ORDER — HYDRALAZINE HCL 20 MG/ML IJ SOLN: 5.0000 mg | INTRAMUSCULAR | Status: DC | PRN

## 2019-12-11 MED ORDER — SODIUM CHLORIDE 0.9 % IV SOLN: INTRAVENOUS | Status: DC | PRN

## 2019-12-11 MED ORDER — PHENYLEPHRINE HCL (PRESSORS) 10 MG/ML IV SOLN
INTRAVENOUS | Status: DC | PRN
Start: 2019-12-11 — End: 2019-12-11
  Administered 2019-12-11: 160 ug via INTRAVENOUS

## 2019-12-11 MED ORDER — PHENOL 1.4 % MT LIQD
1.0000 | OROMUCOSAL | Status: DC | PRN
  Administered 2019-12-14: 1 via OROMUCOSAL
  Filled 2019-12-11: qty 177

## 2019-12-11 MED ORDER — FENTANYL CITRATE (PF) 100 MCG/2ML IJ SOLN: 25.0000 ug | INTRAMUSCULAR | Status: DC | PRN

## 2019-12-11 MED ORDER — PROPOFOL 10 MG/ML IV BOLUS
INTRAVENOUS | Status: DC | PRN
  Administered 2019-12-11: 150 mg via INTRAVENOUS

## 2019-12-11 MED ORDER — ROCURONIUM BROMIDE 10 MG/ML (PF) SYRINGE
PREFILLED_SYRINGE | INTRAVENOUS | Status: DC | PRN
  Administered 2019-12-11: 20 mg via INTRAVENOUS
  Administered 2019-12-11: 60 mg via INTRAVENOUS

## 2019-12-11 MED ORDER — MIDAZOLAM HCL 2 MG/2ML IJ SOLN
INTRAMUSCULAR | Status: AC
  Filled 2019-12-11: qty 2

## 2019-12-11 MED ORDER — LABETALOL HCL 5 MG/ML IV SOLN
10.0000 mg | INTRAVENOUS | Status: AC | PRN
  Administered 2019-12-12: 5 mg via INTRAVENOUS
  Administered 2019-12-12 – 2019-12-13 (×3): 10 mg via INTRAVENOUS
  Filled 2019-12-11 (×4): qty 4

## 2019-12-11 MED ORDER — VASOPRESSIN 20 UNIT/ML IV SOLN
INTRAVENOUS | Status: DC | PRN
Start: 2019-12-11 — End: 2019-12-11
  Administered 2019-12-11: 1 [IU] via INTRAVENOUS

## 2019-12-11 MED ORDER — CHLORHEXIDINE GLUCONATE 0.12 % MT SOLN
OROMUCOSAL | Status: AC
  Administered 2019-12-11: 15 mL via OROMUCOSAL
  Filled 2019-12-11: qty 15

## 2019-12-11 MED ORDER — SUGAMMADEX SODIUM 200 MG/2ML IV SOLN
INTRAVENOUS | Status: DC | PRN
Start: 2019-12-11 — End: 2019-12-11
  Administered 2019-12-11: 400 mg via INTRAVENOUS

## 2019-12-11 MED ORDER — OXYCODONE HCL 5 MG PO TABS: 5.0000 mg | ORAL_TABLET | Freq: Once | ORAL | Status: DC | PRN

## 2019-12-11 MED ORDER — PROPOFOL 10 MG/ML IV BOLUS
INTRAVENOUS | Status: AC
  Filled 2019-12-11: qty 40

## 2019-12-11 MED ORDER — HEPARIN SODIUM (PORCINE) 1000 UNIT/ML IJ SOLN
INTRAMUSCULAR | Status: DC | PRN
  Administered 2019-12-11: 10000 [IU] via INTRAVENOUS

## 2019-12-11 MED ORDER — SODIUM CHLORIDE 0.9 % IV SOLN: 500.0000 mL | Freq: Once | INTRAVENOUS | Status: DC | PRN

## 2019-12-11 MED ORDER — ONDANSETRON HCL 4 MG/2ML IJ SOLN
INTRAMUSCULAR | Status: DC | PRN
  Administered 2019-12-11: 4 mg via INTRAVENOUS

## 2019-12-11 MED ORDER — PHENYLEPHRINE HCL-NACL 10-0.9 MG/250ML-% IV SOLN
INTRAVENOUS | Status: DC | PRN
Start: 2019-12-11 — End: 2019-12-11
  Administered 2019-12-11: 50 ug/min via INTRAVENOUS

## 2019-12-11 MED ORDER — IODIXANOL 320 MG/ML IV SOLN
INTRAVENOUS | Status: DC | PRN
  Administered 2019-12-11: 81.5 mL

## 2019-12-11 MED ORDER — MIDAZOLAM HCL 5 MG/5ML IJ SOLN
INTRAMUSCULAR | Status: DC | PRN
  Administered 2019-12-11: 1 mg via INTRAVENOUS

## 2019-12-11 MED ORDER — FENTANYL CITRATE (PF) 100 MCG/2ML IJ SOLN
INTRAMUSCULAR | Status: DC | PRN
  Administered 2019-12-11: 50 ug via INTRAVENOUS

## 2019-12-11 MED ORDER — OXYCODONE HCL 5 MG/5ML PO SOLN: 5.0000 mg | Freq: Once | ORAL | Status: DC | PRN

## 2019-12-11 MED ORDER — ALBUMIN HUMAN 5 % IV SOLN: INTRAVENOUS | Status: DC | PRN

## 2019-12-11 MED ORDER — PROTAMINE SULFATE 10 MG/ML IV SOLN
INTRAVENOUS | Status: DC | PRN
Start: 2019-12-11 — End: 2019-12-11
  Administered 2019-12-11 (×5): 10 mg via INTRAVENOUS

## 2019-12-11 MED ORDER — CHLORHEXIDINE GLUCONATE 0.12 % MT SOLN
15.0000 mL | Freq: Once | OROMUCOSAL | Status: AC
  Filled 2019-12-11: qty 15

## 2019-12-11 MED ORDER — LACTATED RINGERS IV SOLN: INTRAVENOUS | Status: DC | PRN

## 2019-12-11 MED ORDER — SODIUM CHLORIDE 0.9 % IV SOLN: INTRAVENOUS | Status: AC

## 2019-12-11 MED ORDER — POTASSIUM CHLORIDE CRYS ER 20 MEQ PO TBCR: 20.0000 meq | EXTENDED_RELEASE_TABLET | Freq: Every day | ORAL | Status: DC | PRN

## 2019-12-11 MED ORDER — ONDANSETRON HCL 4 MG/2ML IJ SOLN: 4.0000 mg | Freq: Once | INTRAMUSCULAR | Status: DC | PRN

## 2019-12-11 MED ORDER — MAGNESIUM SULFATE 2 GM/50ML IV SOLN: 2.0000 g | Freq: Every day | INTRAVENOUS | Status: DC | PRN

## 2019-12-11 MED ORDER — GUAIFENESIN-DM 100-10 MG/5ML PO SYRP
15.0000 mL | ORAL_SOLUTION | ORAL | Status: DC | PRN
  Administered 2019-12-13 – 2019-12-16 (×3): 15 mL via ORAL
  Filled 2019-12-11 (×3): qty 15

## 2019-12-11 MED ORDER — METOPROLOL TARTRATE 5 MG/5ML IV SOLN: 2.0000 mg | INTRAVENOUS | Status: DC | PRN

## 2019-12-11 MED ORDER — 0.9 % SODIUM CHLORIDE (POUR BTL) OPTIME
TOPICAL | Status: DC | PRN
  Administered 2019-12-11: 1000 mL

## 2019-12-11 MED ORDER — SODIUM CHLORIDE 0.9 % IV SOLN
INTRAVENOUS | Status: AC
  Filled 2019-12-11: qty 1.2

## 2019-12-11 MED ORDER — CEFAZOLIN SODIUM-DEXTROSE 2-4 GM/100ML-% IV SOLN
2.0000 g | Freq: Three times a day (TID) | INTRAVENOUS | Status: AC
  Administered 2019-12-11 (×2): 2 g via INTRAVENOUS
  Filled 2019-12-11 (×2): qty 100

## 2019-12-11 MED ORDER — VASOPRESSIN 20 UNIT/ML IV SOLN
INTRAVENOUS | Status: AC
  Filled 2019-12-11: qty 1

## 2019-12-11 MED ORDER — FENTANYL CITRATE (PF) 250 MCG/5ML IJ SOLN
INTRAMUSCULAR | Status: AC
  Filled 2019-12-11: qty 5

## 2019-12-11 SURGICAL SUPPLY — 60 items
CANISTER SUCT 3000ML PPV (MISCELLANEOUS) ×4 IMPLANT
CATH ACCU-VU SIZ PIG 5F 100CM (CATHETERS) ×2 IMPLANT
COVER PROBE W GEL 5X96 (DRAPES) ×4 IMPLANT
COVER WAND RF STERILE (DRAPES) ×4 IMPLANT
DERMABOND ADVANCED (GAUZE/BANDAGES/DRESSINGS) ×2
DERMABOND ADVANCED .7 DNX12 (GAUZE/BANDAGES/DRESSINGS) ×2 IMPLANT
DEVICE CLOSURE PERCLS PRGLD 6F (VASCULAR PRODUCTS) IMPLANT
DRAPE HALF SHEET 40X57 (DRAPES) ×2 IMPLANT
DRAPE ZERO GRAVITY STERILE (DRAPES) ×6 IMPLANT
DRSG TEGADERM 2-3/8X2-3/4 SM (GAUZE/BANDAGES/DRESSINGS) ×4 IMPLANT
DRSG TEGADERM 4X4.75 (GAUZE/BANDAGES/DRESSINGS) ×2 IMPLANT
DRYSEAL FLEXSHEATH 20FR 33CM (SHEATH) ×2
ELECT CAUTERY BLADE 6.4 (BLADE) ×6 IMPLANT
ELECT REM PT RETURN 9FT ADLT (ELECTROSURGICAL) ×8
ELECTRODE REM PT RTRN 9FT ADLT (ELECTROSURGICAL) ×4 IMPLANT
GAUZE SPONGE 2X2 8PLY STRL LF (GAUZE/BANDAGES/DRESSINGS) IMPLANT
GLOVE BIOGEL PI IND STRL 7.5 (GLOVE) ×2 IMPLANT
GLOVE BIOGEL PI IND STRL 8 (GLOVE) IMPLANT
GLOVE BIOGEL PI INDICATOR 7.5 (GLOVE) ×2
GLOVE BIOGEL PI INDICATOR 8 (GLOVE) ×2
GLOVE SURG SS PI 7.5 STRL IVOR (GLOVE) ×4 IMPLANT
GOWN STRL REUS W/ TWL LRG LVL3 (GOWN DISPOSABLE) ×4 IMPLANT
GOWN STRL REUS W/ TWL XL LVL3 (GOWN DISPOSABLE) ×2 IMPLANT
GOWN STRL REUS W/TWL LRG LVL3 (GOWN DISPOSABLE) ×4
GOWN STRL REUS W/TWL XL LVL3 (GOWN DISPOSABLE) ×2
GRAFT BALLN CATH 65CM (STENTS) IMPLANT
HEMOSTAT SNOW SURGICEL 2X4 (HEMOSTASIS) IMPLANT
KIT BASIN OR (CUSTOM PROCEDURE TRAY) ×4 IMPLANT
KIT TURNOVER KIT B (KITS) ×4 IMPLANT
NDL PERC 18GX7CM (NEEDLE) IMPLANT
NEEDLE PERC 18GX7CM (NEEDLE) IMPLANT
NS IRRIG 1000ML POUR BTL (IV SOLUTION) ×4 IMPLANT
PACK ENDOVASCULAR (PACKS) ×4 IMPLANT
PAD ARMBOARD 7.5X6 YLW CONV (MISCELLANEOUS) ×8 IMPLANT
PENCIL BUTTON HOLSTER BLD 10FT (ELECTRODE) ×6 IMPLANT
PERCLOSE PROGLIDE 6F (VASCULAR PRODUCTS) ×8
SET MICROPUNCTURE 5F STIFF (MISCELLANEOUS) ×4 IMPLANT
SHEATH AVANTI 11CM 8FR (SHEATH) IMPLANT
SHEATH DRYSEAL FLEX 20FR 33CM (SHEATH) IMPLANT
SHEATH PINNACLE 8F 10CM (SHEATH) ×2 IMPLANT
SHIELD RADPAD SCOOP 12X17 (MISCELLANEOUS) ×8 IMPLANT
SPONGE GAUZE 2X2 STER 10/PKG (GAUZE/BANDAGES/DRESSINGS) ×2
STENT GRAFT BALLN CATH 65CM (STENTS) ×2
STENT GRFT THORAC ACS 31X31X15 (Endovascular Graft) ×2 IMPLANT
STENT GRFT THORAC ACS 31X31X20 (Endovascular Graft) ×2 IMPLANT
STOPCOCK MORSE 400PSI 3WAY (MISCELLANEOUS) ×6 IMPLANT
SUT ETHILON 3 0 PS 1 (SUTURE) IMPLANT
SUT PROLENE 5 0 C 1 24 (SUTURE) IMPLANT
SUT VIC AB 2-0 CT1 27 (SUTURE)
SUT VIC AB 2-0 CT1 TAPERPNT 27 (SUTURE) IMPLANT
SUT VIC AB 3-0 SH 27 (SUTURE)
SUT VIC AB 3-0 SH 27X BRD (SUTURE) IMPLANT
SUT VICRYL 4-0 PS2 18IN ABS (SUTURE) IMPLANT
SYR 30ML LL (SYRINGE) IMPLANT
SYR 50ML LL SCALE MARK (SYRINGE) ×4 IMPLANT
TOWEL GREEN STERILE (TOWEL DISPOSABLE) ×4 IMPLANT
TRAY FOLEY MTR SLVR 16FR STAT (SET/KITS/TRAYS/PACK) ×4 IMPLANT
TUBING HIGH PRESSURE 120CM (CONNECTOR) ×4 IMPLANT
WIRE BENTSON .035X145CM (WIRE) ×2 IMPLANT
WIRE STIFF LUNDERQUIST 260CM (WIRE) ×2 IMPLANT

## 2019-12-11 NOTE — Transfer of Care (Signed)
Immediate Anesthesia Transfer of Care Note  Patient: Mark Molina  Procedure(s) Performed: THORACIC AORTIC ENDOVASCULAR STENT GRAFT (N/A Thoracic) ULTRASOUND GUIDANCE FOR VASCULAR ACCESS (Left Groin)  Patient Location: PACU  Anesthesia Type:General  Level of Consciousness: awake, alert  and oriented  Airway & Oxygen Therapy: Patient Spontanous Breathing and Patient connected to nasal cannula oxygen  Post-op Assessment: Report given to RN and Post -op Vital signs reviewed and stable  Post vital signs: Reviewed and stable  Last Vitals:  Vitals Value Taken Time  BP 139/116 12/11/19 1010  Temp    Pulse 91 12/11/19 1016  Resp 13 12/11/19 1016  SpO2 92 % 12/11/19 1016  Vitals shown include unvalidated device data.  Last Pain:  Vitals:   12/11/19 1010  TempSrc:   PainSc: (P) 0-No pain      Patients Stated Pain Goal: 2 (12/10/19 0800)  Complications: No complications documented.

## 2019-12-11 NOTE — Progress Notes (Signed)
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 He states that he is feeling well overall. He continues to have complaints of right clavicle pain and the inability to take a deep breath or cough without severe pain.  Objective: Vital signs in last 24 hours: Temp:  [97.7 F (36.5 C)-98.7 F (37.1 C)] 97.7 F (36.5 C) (07/11 1108) Pulse Rate:  [79-105] 103 (07/11 1200) Resp:  [13-19] 17 (07/11 1200) BP: (107-141)/(49-116) 117/51 (07/11 1200) SpO2:  [93 %-98 %] 96 % (07/11 1200) Arterial Line BP: (129)/(76) 129/76 (07/11 1025)  Intake/Output from previous day: 07/10 0701 - 07/11 0700 In: 932.5 [I.V.:932.5] Out: 900 [Urine:900] Intake/Output this shift: Total I/O In: 1700 [I.V.:1400; IV Piggyback:300] Out: 260 [Urine:250; Blood:10]  Physical Exam: Patient is awake, conversant, alert, and oriented. His speech is fluent and appropriate. CN grossly intact. MAEW with good strength and symmetry.   Lab Results: Recent Labs    12/10/19 0248 12/11/19 0259  WBC 15.4* 13.5*  HGB 9.8* 9.1*  HCT 30.0* 28.7*  PLT 240 262   BMET Recent Labs    12/10/19 0248 12/11/19 0259  NA 133* 137  K 4.1 4.2  CL 99 101  CO2 25 27  GLUCOSE 141* 147*  BUN 13 19  CREATININE 0.84 1.02  CALCIUM 8.4* 8.8*    Studies/Results: CT Angio Chest/Abd/Pel for Dissection W and/or W/WO  Result Date: 12/10/2019 CLINICAL DATA:  Aneurysm EXAM: CT ANGIOGRAPHY CHEST, ABDOMEN AND PELVIS TECHNIQUE: Multidetector CT imaging through the chest, abdomen and pelvis was performed using the standard protocol during bolus administration of intravenous contrast. Multiplanar reconstructed images and MIPs were obtained and reviewed to evaluate the vascular anatomy. CONTRAST:  70mL OMNIPAQUE IOHEXOL 350 MG/ML SOLN COMPARISON:  12/08/2019 FINDINGS: CTA CHEST FINDINGS Cardiovascular: Heart size normal. No pericardial effusion.  Fair contrast opacification of pulmonary arterial tree without evident filling defect; the exam was not optimized for detection of pulmonary emboli. Scattered coronary arterial calcifications. Good contrast opacification of the thoracic aorta. Aortic Root: --Valve: 2.7 cm --Sinuses: 4.2 cm --Sinotubular Junction: 3.3 cm Limitations by motion: Moderate Thoracic Aorta: --Ascending Aorta: 3.8 cm --Aortic Arch: 3.3 cm Isthmus: 3.8 cm --Descending Aorta: 4 cm at the level of the carina (previously 3.7), 3.9 cm above the diaphragm (previously 3.5) Interval enlargement of the intramural hematoma from just beyond the left subclavian artery origin through the descending thoracic segment, with enlarging intramural blood pool at the posteromedial margin of the proximal descending segment, and scattered new intramural blood pools in the mid and distal descending thoracic segments which were not conspicuous on the prior exam. Mediastinum/Nodes: Mild inflammatory/infiltrative changes around the aortic isthmus and AP window, new since previous. No definite hilar or mediastinal adenopathy. Lungs/Pleura: Small bilateral pleural effusions have developed. There is dependent atelectasis/consolidation posteriorly in both lower lobes. No pneumothorax. Musculoskeletal: Bridging osteophytes across multiple levels in the mid and lower thoracic spine. Horizontal fracture plane just above the inferior endplate of T7, minimally distracted. Multiple lower thoracic spinous process fractures. Review of the MIP images confirms the above findings. CTA ABDOMEN AND PELVIS FINDINGS VASCULAR Aorta: Interval extension of the intramural hematoma to the level of the renal artery origins. New intramural blood pool posterolaterally at the level of the celiac axis origin. Stable atheromatous plaque in the juxtarenal and infrarenal aorta without aneurysm or dissection. Celiac: Patent without evidence of aneurysm, dissection, vasculitis or significant  stenosis. SMA: Patent without evidence  of aneurysm, dissection, vasculitis or significant stenosis. Renals: Duplicated left, inferior dominant, both patent. Single right, widely patent. IMA: Patent without evidence of aneurysm, dissection, vasculitis or significant stenosis. 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 Mild atheromatous plaque. Visualized proximal outflow atheromatous but patent. Veins: No obvious venous abnormality within the limitations of this arterial phase study. Review of the MIP images confirms the above findings. NON-VASCULAR Hepatobiliary: Liver unremarkable. Gallbladder is distended. No biliary ductal dilatation. Pancreas: Unremarkable. No pancreatic ductal dilatation or surrounding inflammatory changes. Spleen: Normal in size without focal abnormality. Adrenals/Urinary Tract: Adrenals unremarkable. 1.2 cm possible cyst from the mid right kidney. No hydronephrosis. Urinary bladder nondistended. Stomach/Bowel: Stomach decompressed. Small bowel is nondilated. Appendix not identified. The colon is nondilated, unremarkable. Lymphatic: No abdominal or pelvic adenopathy. Reproductive: Prostate is unremarkable. Other: No ascites.  No free air. Musculoskeletal: Stable enhancing focus in the posterior right flank with surrounding inflammatory/edematous changes, suggesting small pseudoaneurysm. Multilevel lower lumbar facet DJD. Negative for fracture or worrisome bone lesion. Review of the MIP images confirms the above findings. IMPRESSION: 1. Interval enlargement of intramural hematoma involving descending thoracic and proximal abdominal segments, with new and enlarging intramural blood pools as detailed above. No evidence of compromise of major branch vessels. 2. Dilatation of descending thoracic aorta up to 4 cm diameter (previously 3.7). 3. Thoracic fractures as previously described. 4. Small new pleural effusions.  Electronically Signed   By: Corlis Leak M.D.   On: 12/10/2019 14:35   HYBRID OR IMAGING ([facility] ONLY)  Result Date: 12/11/2019 There is no interpretation for this exam.  This order is for images obtained during a surgical procedure.  Please See "Surgeries" Tab for more information regarding the procedure.    Assessment/Plan: Continue pulmonary toileting and encourage use of incentive sperometer. He continues to remain neurologically stable. Plan for T6-9 stabilization Monday. No new neurosurgical recommendations at this time.    LOS: 4 days    Dorian Heckle, MD 12/11/2019, 3:06 PM

## 2019-12-11 NOTE — Anesthesia Postprocedure Evaluation (Signed)
Anesthesia Post Note  Patient: Mattheus Rauls Wallis  Procedure(s) Performed: THORACIC AORTIC ENDOVASCULAR STENT GRAFT (N/A Thoracic) ULTRASOUND GUIDANCE FOR VASCULAR ACCESS (Left Groin)     Patient location during evaluation: PACU Anesthesia Type: General Level of consciousness: awake and alert Pain management: pain level controlled Vital Signs Assessment: post-procedure vital signs reviewed and stable Respiratory status: spontaneous breathing, nonlabored ventilation, respiratory function stable and patient connected to nasal cannula oxygen Cardiovascular status: blood pressure returned to baseline and stable Postop Assessment: no apparent nausea or vomiting Anesthetic complications: no   No complications documented.  Last Vitals:  Vitals:   12/11/19 1025 12/11/19 1108  BP: 109/77 137/73  Pulse: 83 100  Resp: 16 16  Temp: 36.7 C 36.5 C  SpO2: 97% 93%    Last Pain:  Vitals:   12/11/19 1108  TempSrc: Oral  PainSc: 0-No pain                 Beryle Lathe
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
## 2019-12-11 NOTE — Anesthesia Procedure Notes (Signed)
Procedure Name: Intubation Date/Time: 12/11/2019 8:07 AM Performed by: Gwenyth Allegra, CRNA Pre-anesthesia Checklist: Patient identified, Emergency Drugs available, Suction available, Patient being monitored and Timeout performed Patient Re-evaluated:Patient Re-evaluated prior to induction Oxygen Delivery Method: Circle system utilized Induction Type: IV induction Ventilation: Mask ventilation without difficulty and Oral airway inserted - appropriate to patient size Grade View: Grade I Tube type: Oral Tube size: 7.5 mm Number of attempts: 1 Airway Equipment and Method: Stylet Placement Confirmation: ETT inserted through vocal cords under direct vision,  positive ETCO2 and breath sounds checked- equal and bilateral Secured at: 22 cm Tube secured with: Tape Dental Injury: Teeth and Oropharynx as per pre-operative assessment

## 2019-12-11 NOTE — Progress Notes (Signed)
Pt states he does not want to wear cpap for tonight.  RT will continue to monitor.
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
## 2019-12-12 ENCOUNTER — Inpatient Hospital Stay (HOSPITAL_COMMUNITY): Payer: No Typology Code available for payment source | Admitting: Certified Registered"

## 2019-12-12 ENCOUNTER — Inpatient Hospital Stay (HOSPITAL_COMMUNITY): Payer: No Typology Code available for payment source

## 2019-12-12 ENCOUNTER — Inpatient Hospital Stay (HOSPITAL_COMMUNITY): Admission: EM | Disposition: A | Discharge status: Home Health | Payer: Self-pay | Source: Home / Self Care

## 2019-12-12 ENCOUNTER — Encounter (HOSPITAL_COMMUNITY): Payer: Self-pay

## 2019-12-12 LAB — POCT I-STAT 7, (LYTES, BLD GAS, ICA,H+H)
Acid-Base Excess: 1 mmol/L (ref 0.0–2.0)
Acid-Base Excess: 4 mmol/L — ABNORMAL HIGH (ref 0.0–2.0)
Acid-Base Excess: 3 mmol/L — ABNORMAL HIGH (ref 0.0–2.0)
Bicarbonate: 27.6 mmol/L (ref 20.0–28.0)
Bicarbonate: 29.9 mmol/L — ABNORMAL HIGH (ref 20.0–28.0)
Bicarbonate: 27.5 mmol/L (ref 20.0–28.0)
Calcium, Ion: 1.12 mmol/L — ABNORMAL LOW (ref 1.15–1.40)
Calcium, Ion: 1.12 mmol/L — ABNORMAL LOW (ref 1.15–1.40)
Calcium, Ion: 1.14 mmol/L — ABNORMAL LOW (ref 1.15–1.40)
HCT: 23 % — ABNORMAL LOW (ref 39.0–52.0)
HCT: 28 % — ABNORMAL LOW (ref 39.0–52.0)
HCT: 30 % — ABNORMAL LOW (ref 39.0–52.0)
Hemoglobin: 7.8 g/dL — ABNORMAL LOW (ref 13.0–17.0)
Hemoglobin: 9.5 g/dL — ABNORMAL LOW (ref 13.0–17.0)
Hemoglobin: 10.2 g/dL — ABNORMAL LOW (ref 13.0–17.0)
O2 Saturation: 94 %
O2 Saturation: 99 %
O2 Saturation: 90 %
Potassium: 3.9 mmol/L (ref 3.5–5.1)
Potassium: 4.5 mmol/L (ref 3.5–5.1)
Potassium: 4.1 mmol/L (ref 3.5–5.1)
Sodium: 139 mmol/L (ref 135–145)
Sodium: 138 mmol/L (ref 135–145)
Sodium: 139 mmol/L (ref 135–145)
TCO2: 29 mmol/L (ref 22–32)
TCO2: 31 mmol/L (ref 22–32)
TCO2: 29 mmol/L (ref 22–32)
pCO2 arterial: 51.9 mmHg — ABNORMAL HIGH (ref 32.0–48.0)
pCO2 arterial: 50.6 mmHg — ABNORMAL HIGH (ref 32.0–48.0)
pCO2 arterial: 40.7 mmHg (ref 32.0–48.0)
pH, Arterial: 7.335 — ABNORMAL LOW (ref 7.350–7.450)
pH, Arterial: 7.379 (ref 7.350–7.450)
pH, Arterial: 7.438 (ref 7.350–7.450)
pO2, Arterial: 76 mmHg — ABNORMAL LOW (ref 83.0–108.0)
pO2, Arterial: 120 mmHg — ABNORMAL HIGH (ref 83.0–108.0)
pO2, Arterial: 56 mmHg — ABNORMAL LOW (ref 83.0–108.0)

## 2019-12-12 LAB — GLUCOSE, CAPILLARY
Glucose-Capillary: 180 mg/dL — ABNORMAL HIGH (ref 70–99)
Glucose-Capillary: 212 mg/dL — ABNORMAL HIGH (ref 70–99)
Glucose-Capillary: 185 mg/dL — ABNORMAL HIGH (ref 70–99)
Glucose-Capillary: 174 mg/dL — ABNORMAL HIGH (ref 70–99)
Glucose-Capillary: 171 mg/dL — ABNORMAL HIGH (ref 70–99)

## 2019-12-12 LAB — BASIC METABOLIC PANEL
Anion gap: 10 (ref 5–15)
BUN: 24 mg/dL — ABNORMAL HIGH (ref 8–23)
CO2: 27 mmol/L (ref 22–32)
Calcium: 8.3 mg/dL — ABNORMAL LOW (ref 8.9–10.3)
Chloride: 100 mmol/L (ref 98–111)
Creatinine, Ser: 0.77 mg/dL (ref 0.61–1.24)
GFR calc Af Amer: >60 mL/min (ref >60)
GFR calc non Af Amer: >60 mL/min (ref >60)
Glucose, Bld: 181 mg/dL — ABNORMAL HIGH (ref 70–99)
Potassium: 3.7 mmol/L (ref 3.5–5.1)
Sodium: 137 mmol/L (ref 135–145)

## 2019-12-12 LAB — CBC
HCT: 27.4 % — ABNORMAL LOW (ref 39.0–52.0)
Hemoglobin: 8.9 g/dL — ABNORMAL LOW (ref 13.0–17.0)
MCH: 29.3 pg (ref 26.0–34.0)
MCHC: 32.5 g/dL (ref 30.0–36.0)
MCV: 90.1 fL (ref 80.0–100.0)
Platelets: 298 10*3/uL (ref 150–400)
RBC: 3.04 MIL/uL — ABNORMAL LOW (ref 4.22–5.81)
RDW: 14.8 % (ref 11.5–15.5)
WBC: 10.9 10*3/uL — ABNORMAL HIGH (ref 4.0–10.5)
nRBC: 0 % (ref 0.0–0.2)

## 2019-12-12 LAB — PREPARE RBC (CROSSMATCH)

## 2019-12-12 SURGERY — POSTERIOR LUMBAR FUSION 3 LEVEL
Anesthesia: General | Site: Spine Thoracic

## 2019-12-12 MED ORDER — SODIUM CHLORIDE 0.9% IV SOLUTION: Freq: Once | INTRAVENOUS | Status: DC

## 2019-12-12 MED ORDER — SUCCINYLCHOLINE CHLORIDE 200 MG/10ML IV SOSY
PREFILLED_SYRINGE | INTRAVENOUS | Status: AC
  Filled 2019-12-12: qty 10

## 2019-12-12 MED ORDER — DEXAMETHASONE SODIUM PHOSPHATE 10 MG/ML IJ SOLN
INTRAMUSCULAR | Status: DC | PRN
  Administered 2019-12-12: 5 mg via INTRAVENOUS

## 2019-12-12 MED ORDER — MIDAZOLAM HCL 2 MG/2ML IJ SOLN
INTRAMUSCULAR | Status: AC
  Filled 2019-12-12: qty 2

## 2019-12-12 MED ORDER — 0.9 % SODIUM CHLORIDE (POUR BTL) OPTIME
TOPICAL | Status: DC | PRN
  Administered 2019-12-12: 1000 mL

## 2019-12-12 MED ORDER — FENTANYL CITRATE (PF) 250 MCG/5ML IJ SOLN
INTRAMUSCULAR | Status: AC
  Filled 2019-12-12: qty 5

## 2019-12-12 MED ORDER — FENTANYL CITRATE (PF) 100 MCG/2ML IJ SOLN
INTRAMUSCULAR | Status: DC | PRN
  Administered 2019-12-12 (×6): 50 ug via INTRAVENOUS
  Administered 2019-12-12: 100 ug via INTRAVENOUS
  Administered 2019-12-12: 50 ug via INTRAVENOUS

## 2019-12-12 MED ORDER — THROMBIN 5000 UNITS EX SOLR
CUTANEOUS | Status: AC
  Filled 2019-12-12: qty 5000

## 2019-12-12 MED ORDER — HYDROMORPHONE HCL 1 MG/ML IJ SOLN
INTRAMUSCULAR | Status: AC
  Filled 2019-12-12: qty 1

## 2019-12-12 MED ORDER — HYDROMORPHONE HCL 1 MG/ML IJ SOLN
INTRAMUSCULAR | Status: AC
  Administered 2019-12-12: 0.5 mg via INTRAVENOUS
  Filled 2019-12-12: qty 1

## 2019-12-12 MED ORDER — PROMETHAZINE HCL 25 MG/ML IJ SOLN: 6.2500 mg | INTRAMUSCULAR | Status: DC | PRN

## 2019-12-12 MED ORDER — PHENYLEPHRINE 40 MCG/ML (10ML) SYRINGE FOR IV PUSH (FOR BLOOD PRESSURE SUPPORT)
PREFILLED_SYRINGE | INTRAVENOUS | Status: AC
  Filled 2019-12-12: qty 20

## 2019-12-12 MED ORDER — THROMBIN 5000 UNITS EX SOLR
OROMUCOSAL | Status: DC | PRN
  Administered 2019-12-12 (×2): 5 mL via TOPICAL

## 2019-12-12 MED ORDER — LACTATED RINGERS IV SOLN: INTRAVENOUS | Status: DC

## 2019-12-12 MED ORDER — CEFAZOLIN SODIUM-DEXTROSE 2-4 GM/100ML-% IV SOLN
INTRAVENOUS | Status: AC
  Filled 2019-12-12: qty 100

## 2019-12-12 MED ORDER — SUCCINYLCHOLINE CHLORIDE 20 MG/ML IJ SOLN
INTRAMUSCULAR | Status: DC | PRN
Start: 2019-12-12 — End: 2019-12-12
  Administered 2019-12-12: 160 mg via INTRAVENOUS

## 2019-12-12 MED ORDER — PROPOFOL 10 MG/ML IV BOLUS
INTRAVENOUS | Status: AC
  Filled 2019-12-12: qty 20

## 2019-12-12 MED ORDER — SODIUM CHLORIDE 0.9 % IV SOLN: INTRAVENOUS | Status: DC | PRN

## 2019-12-12 MED ORDER — ONDANSETRON HCL 4 MG/2ML IJ SOLN
INTRAMUSCULAR | Status: DC | PRN
  Administered 2019-12-12: 4 mg via INTRAVENOUS

## 2019-12-12 MED ORDER — ACETAMINOPHEN 10 MG/ML IV SOLN
INTRAVENOUS | Status: AC
  Administered 2019-12-12: 1000 mg via INTRAVENOUS
  Filled 2019-12-12: qty 100

## 2019-12-12 MED ORDER — CHLORHEXIDINE GLUCONATE 0.12 % MT SOLN: 15.0000 mL | Freq: Once | OROMUCOSAL | Status: AC

## 2019-12-12 MED ORDER — PROPOFOL 10 MG/ML IV BOLUS
INTRAVENOUS | Status: DC | PRN
  Administered 2019-12-12: 140 mg via INTRAVENOUS

## 2019-12-12 MED ORDER — ROCURONIUM BROMIDE 10 MG/ML (PF) SYRINGE
PREFILLED_SYRINGE | INTRAVENOUS | Status: DC | PRN
  Administered 2019-12-12: 50 mg via INTRAVENOUS
  Administered 2019-12-12: 30 mg via INTRAVENOUS
  Administered 2019-12-12: 20 mg via INTRAVENOUS

## 2019-12-12 MED ORDER — HYDROMORPHONE HCL 1 MG/ML IJ SOLN
0.2500 mg | INTRAMUSCULAR | Status: DC | PRN
  Administered 2019-12-12 (×3): 0.5 mg via INTRAVENOUS

## 2019-12-12 MED ORDER — METHOCARBAMOL 1000 MG/10ML IJ SOLN
500.0000 mg | Freq: Once | INTRAVENOUS | Status: AC
  Administered 2019-12-12: 500 mg via INTRAVENOUS
  Filled 2019-12-12 (×2): qty 5

## 2019-12-12 MED ORDER — BUPIVACAINE HCL (PF) 0.5 % IJ SOLN
INTRAMUSCULAR | Status: AC
  Filled 2019-12-12: qty 30

## 2019-12-12 MED ORDER — DEXAMETHASONE SODIUM PHOSPHATE 10 MG/ML IJ SOLN
INTRAMUSCULAR | Status: AC
  Filled 2019-12-12: qty 1

## 2019-12-12 MED ORDER — LIDOCAINE-EPINEPHRINE 1 %-1:100000 IJ SOLN
INTRAMUSCULAR | Status: DC | PRN
  Administered 2019-12-12: 5 mL

## 2019-12-12 MED ORDER — LIDOCAINE-EPINEPHRINE 1 %-1:100000 IJ SOLN
INTRAMUSCULAR | Status: AC
  Filled 2019-12-12: qty 1

## 2019-12-12 MED ORDER — ACETAMINOPHEN 10 MG/ML IV SOLN: 1000.0000 mg | Freq: Four times a day (QID) | INTRAVENOUS | Status: DC

## 2019-12-12 MED ORDER — IPRATROPIUM-ALBUTEROL 0.5-2.5 (3) MG/3ML IN SOLN: 3.0000 mL | Freq: Four times a day (QID) | RESPIRATORY_TRACT | Status: DC | PRN

## 2019-12-12 MED ORDER — MEPERIDINE HCL 25 MG/ML IJ SOLN: 6.2500 mg | INTRAMUSCULAR | Status: DC | PRN

## 2019-12-12 MED ORDER — SUGAMMADEX SODIUM 200 MG/2ML IV SOLN
INTRAVENOUS | Status: DC | PRN
  Administered 2019-12-12: 200 mg via INTRAVENOUS

## 2019-12-12 MED ORDER — DEXMEDETOMIDINE HCL IN NACL 200 MCG/50ML IV SOLN
INTRAVENOUS | Status: DC | PRN
Start: 2019-12-12 — End: 2019-12-12
  Administered 2019-12-12: 8 ug via INTRAVENOUS
  Administered 2019-12-12: 16 ug via INTRAVENOUS

## 2019-12-12 MED ORDER — CEFAZOLIN SODIUM-DEXTROSE 2-4 GM/100ML-% IV SOLN
2.0000 g | Freq: Once | INTRAVENOUS | Status: AC
  Administered 2019-12-12: 2 g via INTRAVENOUS

## 2019-12-12 MED ORDER — METOPROLOL TARTRATE 5 MG/5ML IV SOLN
INTRAVENOUS | Status: AC
  Filled 2019-12-12: qty 5

## 2019-12-12 MED ORDER — PANTOPRAZOLE SODIUM 40 MG IV SOLR
40.0000 mg | Freq: Two times a day (BID) | INTRAVENOUS | Status: DC
  Administered 2019-12-12 – 2019-12-16 (×9): 40 mg via INTRAVENOUS
  Filled 2019-12-12 (×9): qty 40

## 2019-12-12 MED ORDER — ONDANSETRON HCL 4 MG/2ML IJ SOLN
INTRAMUSCULAR | Status: AC
  Filled 2019-12-12: qty 2

## 2019-12-12 MED ORDER — ALBUTEROL SULFATE (2.5 MG/3ML) 0.083% IN NEBU
INHALATION_SOLUTION | RESPIRATORY_TRACT | Status: AC
  Filled 2019-12-12: qty 3

## 2019-12-12 MED ORDER — CHLORHEXIDINE GLUCONATE 0.12 % MT SOLN
OROMUCOSAL | Status: AC
  Administered 2019-12-12: 15 mL via OROMUCOSAL
  Filled 2019-12-12: qty 15

## 2019-12-12 MED ORDER — ROCURONIUM BROMIDE 10 MG/ML (PF) SYRINGE
PREFILLED_SYRINGE | INTRAVENOUS | Status: AC
  Filled 2019-12-12: qty 10

## 2019-12-12 MED ORDER — LIDOCAINE 2% (20 MG/ML) 5 ML SYRINGE
INTRAMUSCULAR | Status: DC | PRN
  Administered 2019-12-12: 100 mg via INTRAVENOUS

## 2019-12-12 MED ORDER — BUPIVACAINE HCL (PF) 0.5 % IJ SOLN
INTRAMUSCULAR | Status: DC | PRN
  Administered 2019-12-12: 5 mL

## 2019-12-12 MED ORDER — PHENYLEPHRINE HCL (PRESSORS) 10 MG/ML IV SOLN
INTRAVENOUS | Status: DC | PRN
  Administered 2019-12-12: 120 ug via INTRAVENOUS
  Administered 2019-12-12: 80 ug via INTRAVENOUS

## 2019-12-12 MED ORDER — LIDOCAINE 2% (20 MG/ML) 5 ML SYRINGE
INTRAMUSCULAR | Status: AC
  Filled 2019-12-12: qty 5

## 2019-12-12 MED ORDER — SODIUM CHLORIDE 0.9 % IV SOLN
INTRAVENOUS | Status: DC | PRN
  Administered 2019-12-12: 250 mL via INTRAVENOUS

## 2019-12-12 SURGICAL SUPPLY — 67 items
BAG DECANTER FOR FLEXI CONT (MISCELLANEOUS) ×3 IMPLANT
BASKET BONE COLLECTION (BASKET) ×3 IMPLANT
BENZOIN TINCTURE PRP APPL 2/3 (GAUZE/BANDAGES/DRESSINGS) IMPLANT
BLADE CLIPPER SURG (BLADE) ×3 IMPLANT
BLADE SURG 11 STRL SS (BLADE) ×3 IMPLANT
BUR MATCHSTICK NEURO 3.0 LAGG (BURR) ×3 IMPLANT
BUR PRECISION FLUTE 5.0 (BURR) ×3 IMPLANT
CANISTER SUCT 3000ML PPV (MISCELLANEOUS) ×3 IMPLANT
CARTRIDGE OIL MAESTRO DRILL (MISCELLANEOUS) IMPLANT
CLOSURE WOUND 1/2 X4 (GAUZE/BANDAGES/DRESSINGS)
CNTNR URN SCR LID CUP LEK RST (MISCELLANEOUS) ×1 IMPLANT
CONT SPEC 4OZ STRL OR WHT (MISCELLANEOUS) ×2
COVER BACK TABLE 60X90IN (DRAPES) ×3 IMPLANT
COVER WAND RF STERILE (DRAPES) IMPLANT
DECANTER SPIKE VIAL GLASS SM (MISCELLANEOUS) ×3 IMPLANT
DERMABOND ADVANCED (GAUZE/BANDAGES/DRESSINGS) ×2
DERMABOND ADVANCED .7 DNX12 (GAUZE/BANDAGES/DRESSINGS) ×1 IMPLANT
DIFFUSER DRILL AIR PNEUMATIC (MISCELLANEOUS) IMPLANT
DRAPE C-ARM 42X72 X-RAY (DRAPES) ×3 IMPLANT
DRAPE C-ARMOR (DRAPES) ×3 IMPLANT
DRAPE LAPAROTOMY 100X72X124 (DRAPES) ×3 IMPLANT
DRAPE SURG 17X23 STRL (DRAPES) ×3 IMPLANT
DRSG OPSITE POSTOP 4X8 (GAUZE/BANDAGES/DRESSINGS) ×3 IMPLANT
DURAPREP 26ML APPLICATOR (WOUND CARE) ×3 IMPLANT
ELECT REM PT RETURN 9FT ADLT (ELECTROSURGICAL) ×3
ELECTRODE REM PT RTRN 9FT ADLT (ELECTROSURGICAL) ×1 IMPLANT
EXTENDER TAB GUIDE SV 5.5/6.0 (INSTRUMENTS) ×48 IMPLANT
GAUZE 4X4 16PLY RFD (DISPOSABLE) ×3 IMPLANT
GAUZE SPONGE 4X4 12PLY STRL (GAUZE/BANDAGES/DRESSINGS) IMPLANT
GLOVE BIO SURGEON STRL SZ7.5 (GLOVE) ×12 IMPLANT
GLOVE BIOGEL PI IND STRL 7.5 (GLOVE) ×3 IMPLANT
GLOVE BIOGEL PI INDICATOR 7.5 (GLOVE) ×6
GLOVE ECLIPSE 7.0 STRL STRAW (GLOVE) ×6 IMPLANT
GLOVE EXAM NITRILE XL STR (GLOVE) IMPLANT
GOWN STRL REUS W/ TWL LRG LVL3 (GOWN DISPOSABLE) ×5 IMPLANT
GOWN STRL REUS W/ TWL XL LVL3 (GOWN DISPOSABLE) IMPLANT
GOWN STRL REUS W/TWL 2XL LVL3 (GOWN DISPOSABLE) ×3 IMPLANT
GOWN STRL REUS W/TWL LRG LVL3 (GOWN DISPOSABLE) ×10
GOWN STRL REUS W/TWL XL LVL3 (GOWN DISPOSABLE)
GUIDEWIRE BLUNT NT 450 (WIRE) ×24 IMPLANT
HEMOSTAT POWDER KIT SURGIFOAM (HEMOSTASIS) ×6 IMPLANT
KIT BASIN OR (CUSTOM PROCEDURE TRAY) ×3 IMPLANT
KIT TURNOVER KIT B (KITS) ×3 IMPLANT
MILL MEDIUM DISP (BLADE) ×3 IMPLANT
NEEDLE HYPO 18GX1.5 BLUNT FILL (NEEDLE) IMPLANT
NEEDLE HYPO 22GX1.5 SAFETY (NEEDLE) ×3 IMPLANT
NEEDLE SPNL 18GX3.5 QUINCKE PK (NEEDLE) ×9 IMPLANT
NEEDLE TROCAR PAK (NEEDLE) ×6 IMPLANT
NS IRRIG 1000ML POUR BTL (IV SOLUTION) ×3 IMPLANT
OIL CARTRIDGE MAESTRO DRILL (MISCELLANEOUS)
PACK LAMINECTOMY NEURO (CUSTOM PROCEDURE TRAY) ×3 IMPLANT
PAD ARMBOARD 7.5X6 YLW CONV (MISCELLANEOUS) ×9 IMPLANT
PUTTY DBF 1CC CORTICAL FIBERS (Putty) ×3 IMPLANT
ROD 5.5 TI AL STRT PERC 110 (Rod) ×6 IMPLANT
SCREW MAS VOYAGER 6.5X40 (Screw) ×24 IMPLANT
SCREW SET 5.5/6.0MM SOLERA (Screw) ×24 IMPLANT
SPONGE LAP 4X18 RFD (DISPOSABLE) ×3 IMPLANT
SPONGE SURGIFOAM ABS GEL 100 (HEMOSTASIS) IMPLANT
STRIP CLOSURE SKIN 1/2X4 (GAUZE/BANDAGES/DRESSINGS) IMPLANT
SUT VIC AB 0 CT1 18XCR BRD8 (SUTURE) ×1 IMPLANT
SUT VIC AB 0 CT1 8-18 (SUTURE) ×2
SUT VICRYL 3-0 RB1 18 ABS (SUTURE) ×9 IMPLANT
SYR 3ML LL SCALE MARK (SYRINGE) ×9 IMPLANT
TOWEL GREEN STERILE (TOWEL DISPOSABLE) ×3 IMPLANT
TOWEL GREEN STERILE FF (TOWEL DISPOSABLE) ×3 IMPLANT
TRAY FOLEY MTR SLVR 16FR STAT (SET/KITS/TRAYS/PACK) IMPLANT
WATER STERILE IRR 1000ML POUR (IV SOLUTION) ×3 IMPLANT

## 2019-12-12 NOTE — Progress Notes (Signed)
Dr. Conchita Paris notified of Lovenox administration.

## 2019-12-12 NOTE — Anesthesia Procedure Notes (Signed)
Arterial Line Insertion Performed by: Lewie Loron, MD, Francess Mullen, Margurite Auerbach, CRNA, CRNA  Preanesthetic checklist: patient identified, IV checked, site marked, risks and benefits discussed, surgical consent, monitors and equipment checked, pre-op evaluation and timeout performed Right, radial was placed Catheter size: 20 G  Attempts: 1 Procedure performed without using ultrasound guided technique. Following insertion, Biopatch and dressing applied. Post procedure assessment: normal  Patient tolerated the procedure well with no immediate complications.

## 2019-12-12 NOTE — Anesthesia Postprocedure Evaluation (Addendum)
Anesthesia Post Note  Patient: Mark Molina  Procedure(s) Performed: THORACIC SIX - SEVEN THORACIC SEVEN- EIGHT THORACIC EIGHT - NINE OPEN REDUCTION INTERNAL FIXATION (N/A Spine Thoracic)     Patient location during evaluation: PACU Anesthesia Type: General Level of consciousness: sedated and patient cooperative Pain management: pain level controlled Vital Signs Assessment: post-procedure vital signs reviewed and stable Respiratory status: spontaneous breathing Cardiovascular status: stable Anesthetic complications: no Comments: Pt very delirious upon arrival to PACU. He immediately reached up and pulled his NGT out. He fought the O2 mask, but eventually accepted [name]. Precidex bolus was given and he calmed down. His SpO2 remained 94-97%. His lungs sounded clear. I didn't hear any rhonchi. At one point he sounded mildly wheezy, albuterol neb was given. Repeat blood gas showed PaO2 of 54, though SpO2 was 91-93% when drawn. Discussed with Dr. Andrey Campanile. Dr. Andrey Campanile requests that we replace the NGT. Many attempts were made with Mark Molina to reason and allow [place] to place the NGT, but he was adamant he would resist. One attempt was made and, given his size, we were unable to get it down. Attempts were made to find his wife to allow her to help ease his anxiety and delirium.  Update: contact was made with Mark Molina and she spoke with the patient on the phone attempting to reorient him. He continued to be oriented to only self. The patient's sister was present and we had her come to bedside and assist in convincing him he was in a hospital and that he needed an NGT. Eventually, he agreed, and with the wife Mark Molina) on the phone and sister at bedside NGT was successfully placed with one attempt. Immediately 500 ml of green fluid rapidly drained. Total volume of NGT output was >1 L in PACU. He was then discharged to 4N without issue.   No complications documented.  Last Vitals:  Vitals:   12/12/19 1650  12/12/19 1721  BP: 130/69 (!) 119/97  Pulse: (!) 101 (!) 102  Resp: 14 15  Temp: 36.4 C   SpO2: 96%     Last Pain:  Vitals:   12/12/19 1650  TempSrc:   PainSc: Asleep                 Lewie Loron
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
## 2019-12-12 NOTE — Anesthesia Procedure Notes (Addendum)
Procedure Name: Intubation Date/Time: 12/12/2019 1:34 PM Performed by: Candis Shine, CRNA Pre-anesthesia Checklist: Patient identified, Emergency Drugs available, Suction available and Patient being monitored Patient Re-evaluated:Patient Re-evaluated prior to induction Oxygen Delivery Method: Circle System Utilized Preoxygenation: Pre-oxygenation with 100% oxygen Induction Type: IV induction, Rapid sequence and Cricoid Pressure applied Laryngoscope Size: Mac and 4 Grade View: Grade I Tube type: Oral Tube size: 7.5 mm Number of attempts: 1 Airway Equipment and Method: Stylet Placement Confirmation: ETT inserted through vocal cords under direct vision,  positive ETCO2 and breath sounds checked- equal and bilateral Secured at: 23 cm Tube secured with: Tape Dental Injury: Teeth and Oropharynx as per pre-operative assessment  Comments: Intubation performed by Champ Mungo, SRNA.
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
## 2019-12-12 NOTE — Progress Notes (Signed)
Dr. Renold Don notified that patient is in A-Fib.
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
## 2019-12-12 NOTE — Progress Notes (Signed)
    Subjective  - POD #1, s/p TEVAR  No acute overnight events C/o reflux Does not like his bed   Physical Exam:  Palpable pedal pulses abd soft Groin site ok without hematoma Moving all extremities      Assessment/Plan:  POD #1  Stable from vascular perspective Will need f/u CTA in 1 month, which I will order  Wells Neilan Rizzo 12/12/2019 7:51 AM --  Vitals:   12/12/19 0518 12/12/19 0746  BP: (!) 149/73 (!) 158/83  Pulse:  82  Resp:  19  Temp:  97.7 F (36.5 C)  SpO2:  95%    Intake/Output Summary (Last 24 hours) at 12/12/2019 0751 Last data filed at 12/12/2019 0516 Gross per 24 hour  Intake 2011.49 ml  Output 1060 ml  Net 951.49 ml     Laboratory CBC    Component Value Date/Time   WBC 10.9 (H) 12/12/2019 0445   HGB 8.9 (L) 12/12/2019 0445   HCT 27.4 (L) 12/12/2019 0445   PLT 298 12/12/2019 0445    BMET    Component Value Date/Time   NA 137 12/12/2019 0445   K 3.7 12/12/2019 0445   CL 100 12/12/2019 0445   CO2 27 12/12/2019 0445   GLUCOSE 181 (H) 12/12/2019 0445   BUN 24 (H) 12/12/2019 0445   CREATININE 0.77 12/12/2019 0445   CALCIUM 8.3 (L) 12/12/2019 0445   GFRNONAA >60 12/12/2019 0445   GFRAA >60 12/12/2019 0445    COAG Lab Results  Component Value Date   INR 1.2 12/11/2019   No results found for: PTT  Antibiotics Anti-infectives (From admission, onward)   Start     Dose/Rate Route Frequency Ordered Stop   12/12/19 1100  ceFAZolin (ANCEF) 3 g in dextrose 5 % 50 mL IVPB       Note to Pharmacy: Send with pt to OR   3 g 100 mL/hr over 30 Minutes Intravenous On call to O.R. 12/10/19 2237 12/11/19 0850   12/11/19 1700  ceFAZolin (ANCEF) IVPB 2g/100 mL premix        2 g 200 mL/hr over 30 Minutes Intravenous Every 8 hours 12/11/19 1033 12/12/19 0024   12/11/19 0000  ceFAZolin (ANCEF) IVPB 1 g/50 mL premix  Status:  Discontinued       Note to Pharmacy: Send with pt to OR   1 g 100 mL/hr over 30 Minutes Intravenous On call 12/10/19  1835 12/10/19 2237       V. Charlena Cross, M.D., [facility] [place] Office: [phone] Pager:  [phone]

## 2019-12-12 NOTE — Progress Notes (Signed)
Patient is off the floor in OR. Report was called to OR staff.

## 2019-12-12 NOTE — Transfer of Care (Signed)
Immediate Anesthesia Transfer of Care Note  Patient: Mark Molina  Procedure(s) Performed: THORACIC SIX - SEVEN THORACIC SEVEN- EIGHT THORACIC EIGHT - NINE OPEN REDUCTION INTERNAL FIXATION (N/A Spine Thoracic)  Patient Location: PACU  Anesthesia Type:General  Level of Consciousness: drowsy and patient cooperative  Airway & Oxygen Therapy: Patient Spontanous Breathing and Patient connected to face mask oxygen  Post-op Assessment: Report given to RN and Post -op Vital signs reviewed and stable  Post vital signs: Reviewed and stable  Last Vitals:  Vitals Value Taken Time  BP 130/69   Temp    Pulse 102   Resp    SpO2 93     Last Pain:  Vitals:   12/12/19 0746  TempSrc: Oral  PainSc: 4       Patients Stated Pain Goal: 2 (12/10/19 0800)  Complications: No complications documented.

## 2019-12-13 ENCOUNTER — Inpatient Hospital Stay (HOSPITAL_COMMUNITY): Payer: No Typology Code available for payment source

## 2019-12-13 LAB — CBC
HCT: 27.2 % — ABNORMAL LOW (ref 39.0–52.0)
Hemoglobin: 8.7 g/dL — ABNORMAL LOW (ref 13.0–17.0)
MCH: 29.3 pg (ref 26.0–34.0)
MCHC: 32 g/dL (ref 30.0–36.0)
MCV: 91.6 fL (ref 80.0–100.0)
Platelets: 294 10*3/uL (ref 150–400)
RBC: 2.97 MIL/uL — ABNORMAL LOW (ref 4.22–5.81)
RDW: 15.1 % (ref 11.5–15.5)
WBC: 12.2 10*3/uL — ABNORMAL HIGH (ref 4.0–10.5)
nRBC: 0 % (ref 0.0–0.2)

## 2019-12-13 LAB — BASIC METABOLIC PANEL
Anion gap: 11 (ref 5–15)
BUN: 26 mg/dL — ABNORMAL HIGH (ref 8–23)
CO2: 27 mmol/L (ref 22–32)
Calcium: 8.6 mg/dL — ABNORMAL LOW (ref 8.9–10.3)
Chloride: 101 mmol/L (ref 98–111)
Creatinine, Ser: 0.88 mg/dL (ref 0.61–1.24)
GFR calc Af Amer: >60 mL/min (ref >60)
GFR calc non Af Amer: >60 mL/min (ref >60)
Glucose, Bld: 152 mg/dL — ABNORMAL HIGH (ref 70–99)
Potassium: 4.1 mmol/L (ref 3.5–5.1)
Sodium: 139 mmol/L (ref 135–145)

## 2019-12-13 LAB — GLUCOSE, CAPILLARY
Glucose-Capillary: 163 mg/dL — ABNORMAL HIGH (ref 70–99)
Glucose-Capillary: 134 mg/dL — ABNORMAL HIGH (ref 70–99)
Glucose-Capillary: 143 mg/dL — ABNORMAL HIGH (ref 70–99)
Glucose-Capillary: 149 mg/dL — ABNORMAL HIGH (ref 70–99)

## 2019-12-13 MED FILL — Thrombin For Soln 5000 Unit: CUTANEOUS | Qty: 5000 | Status: AC

## 2019-12-13 NOTE — Progress Notes (Addendum)
Patient ID: Mark Molina, male   DOB: [birth year], 64 y.o.   MRN: [id number] 1 Day Post-Op   Subjective: Feeling much better this AM, productive cough, NGT removed earlier this AM  ROS negative except as listed above. Objective: Vital signs in last 24 hours: Temp:  [97.6 F (36.4 C)-99.2 F (37.3 C)] 99.2 F (37.3 C) (07/13 0800) Pulse Rate:  [60-147] 110 (07/13 1002) Resp:  [13-46] 25 (07/13 1002) BP: (119-156)/(57-114) 120/66 (07/13 1002) SpO2:  [89 %-100 %] 97 % (07/13 1002) Arterial Line BP: (131-191)/(39-70) 138/44 (07/13 0700) Weight:  [116.1 kg] 116.1 kg (07/12 2030) Last BM Date: 12/07/19  Intake/Output from previous day: 07/12 0701 - 07/13 0700 In: 2295.1 [I.V.:1680; Blood:315; IV Piggyback:300.1] Out: 2705 [Urine:905; Emesis/NG output:1150; Blood:100] Intake/Output this shift: Total I/O In: 270 [P.O.:240; I.V.:30] Out: -   General appearance: alert and cooperative Resp: clear after cough Cardio: regular rate and rhythm GI: soft, mod distention, NT, a few BS Extremities: calves soft Neurologic: Mental status: Alert, oriented, thought content appropriate Motor: MAE  Lab Results: CBC  Recent Labs    12/12/19 0445 12/12/19 1421 12/12/19 1758 12/13/19 0511  WBC 10.9*  --   --  12.2*  HGB 8.9*   < > 10.2* 8.7*  HCT 27.4*   < > 30.0* 27.2*  PLT 298  --   --  294   < > = values in this interval not displayed.   BMET Recent Labs    12/12/19 0445 12/12/19 1421 12/12/19 1758 12/13/19 0511  NA 137   < > 139 139  K 3.7   < > 4.1 4.1  CL 100  --   --  101  CO2 27  --   --  27  GLUCOSE 181*  --   --  152*  BUN 24*  --   --  26*  CREATININE 0.77  --   --  0.88  CALCIUM 8.3*  --   --  8.6*   < > = values in this interval not displayed.   PT/INR Recent Labs    12/11/19 0259  LABPROT 14.7  INR 1.2   ABG Recent Labs    12/12/19 1551 12/12/19 1758  PHART 7.379 7.438  HCO3 29.9* 27.5    Studies/Results: DG Thoracic Spine 2 View  Result  Date: 12/12/2019 CLINICAL DATA:  T6-T9 fusion. EXAM: THORACIC SPINE 2 VIEWS; DG C-ARM 1-60 MIN COMPARISON:  None. FINDINGS: Two fluoroscopic spot views obtained in the lumbar spine in frontal and lateral projections. Posterior rod with intrapedicular screws, levels difficult to delineate on the provided: Views. Aortic stent graft in place. Total fluoroscopy time 2 minutes 18 seconds. IMPRESSION: Fluoroscopic spot views after thoracic fusion. Electronically Signed   By: Narda Rutherford M.D.   On: 12/12/2019 17:50   DG CHEST PORT 1 VIEW  Result Date: 12/12/2019 CLINICAL DATA:  64 year old male with respiratory failure. NG placement. EXAM: PORTABLE CHEST 1 VIEW COMPARISON:  Chest radiograph dated 12/08/2019 and CT dated 12/10/2019. FINDINGS: An enteric tube is noted extending below the diaphragm with tip beyond the inferior margin of the image. There is mild cardiomegaly and vascular congestion. Bibasilar densities may represent atelectasis/congestion although pneumonia is not excluded. Clinical correlation is recommended. No significant pleural effusion or pneumothorax. Postsurgical changes of endovascular stent graft repair of the descending thoracic aorta and midthoracic spine fusion hardware. Right posterior rib fractures IMPRESSION: 1. Enteric tube with tip beyond the inferior margin of the image. 2. Mild cardiomegaly and vascular  congestion. 3. Bibasilar atelectasis versus infiltrate. 4. Postsurgical changes of endovascular stent graft repair of the descending thoracic aorta and thoracic spine fusion. 5. Multiple posterior right rib fractures. Electronically Signed   By: Elgie Collard M.D.   On: 12/12/2019 20:55   DG Abd Portable 1V  Result Date: 12/12/2019 CLINICAL DATA:  Enteric catheter placement, aortic stent graft and thoracic spinal fusion after trauma EXAM: PORTABLE ABDOMEN - 1 VIEW COMPARISON:  12/10/2019 FINDINGS: Frontal view of the lower chest and upper abdomen demonstrates tip and side  port of an enteric catheter projecting over the gastric body. Mild gaseous distention of the bowel likely reflects ileus. Partial visualization of midthoracic spinal fusion. Endoluminal stent graft is identified within the visualized thoracoabdominal aorta. IMPRESSION: 1. Enteric catheter overlying the gastric body. Electronically Signed   By: Sharlet Salina M.D.   On: 12/12/2019 20:55   DG C-Arm 1-60 Min  Result Date: 12/12/2019 CLINICAL DATA:  T6-T9 fusion. EXAM: THORACIC SPINE 2 VIEWS; DG C-ARM 1-60 MIN COMPARISON:  None. FINDINGS: Two fluoroscopic spot views obtained in the lumbar spine in frontal and lateral projections. Posterior rod with intrapedicular screws, levels difficult to delineate on the provided: Views. Aortic stent graft in place. Total fluoroscopy time 2 minutes 18 seconds. IMPRESSION: Fluoroscopic spot views after thoracic fusion. Electronically Signed   By: Narda Rutherford M.D.   On: 12/12/2019 17:50    Anti-infectives: Anti-infectives (From admission, onward)   Start     Dose/Rate Route Frequency Ordered Stop   12/12/19 1300  ceFAZolin (ANCEF) IVPB 2g/100 mL premix        2 g 200 mL/hr over 30 Minutes Intravenous  Once 12/12/19 1247 12/12/19 1339   12/12/19 1249  ceFAZolin (ANCEF) 2-4 GM/100ML-% IVPB       Note to Pharmacy: Shireen Quan   : cabinet override      12/12/19 1249 12/13/19 0059   12/12/19 1100  ceFAZolin (ANCEF) 3 g in dextrose 5 % 50 mL IVPB       Note to Pharmacy: Send with pt to OR   3 g 100 mL/hr over 30 Minutes Intravenous On call to O.R. 12/10/19 2237 12/11/19 0850   12/11/19 1700  ceFAZolin (ANCEF) IVPB 2g/100 mL premix        2 g 200 mL/hr over 30 Minutes Intravenous Every 8 hours 12/11/19 1033 12/12/19 0024   12/11/19 0000  ceFAZolin (ANCEF) IVPB 1 g/50 mL premix  Status:  Discontinued       Note to Pharmacy: Send with pt to OR   1 g 100 mL/hr over 30 Minutes Intravenous On call 12/10/19 1835 12/10/19 2237       Assessment/Plan: [facility]  Multiple R rib fxs with tiny R H/PNX - multimodal pain control and pulm toilet, pulled 1250 on IS, CXR  No PTX R pulm contusion - IS/pulm toilet, chest PT, PRN BDs Acute hypoxic respiratory failure - [place] O2, above, aspirated on induction 7/12 R scapula fx - Ortho c/s (Dr. August Saucer), likely non-operative, sling when OOB Aortic injury - VVS c/s (Dr. Myra Gianotti) s/p TEVAR 7/11 Unstable T7/8 fracture - S/P T6-T9 fusion by Dr. Conchita Paris 7/12, brace Road rash - local wound care ABL anemia Recent stroke 08/2019- no residual weakness, on eliquis (holding) HTN - home meds restarted GERD - Protonix IV Q12, exacerbated by ileus  DM- SSI OSA - uses CPAP at home, resumed VTE -SCDs, lovenox to resume 7/14 per Dr. Conchita Paris FEN -ileus, clears as tolerated and await improvement in bowel function  Dispo -transfer to SDU, PT/OT I aslo spoke with his sister at the bedside.  LOS: 6 days    Violeta Gelinas, MD, MPH, FACS Trauma & General Surgery Use [email or web address] to contact on call provider  12/13/2019

## 2019-12-13 NOTE — Progress Notes (Signed)
  NEUROSURGERY PROGRESS NOTE   No issues overnight.  Complains of appropriate back soreness No N/T/W in BLE  EXAM:  BP 132/79   Pulse 94   Temp 97.6 F (36.4 C) (Oral)   Resp 14   Ht 6' (1.829 m)   Wt 116.1 kg   SpO2 97%   BMI 34.71 kg/m   Awake, alert, oriented  Speech fluent, appropriate  CN grossly intact  5/5 BLE  Incision: c/d/i  IMPRESSION/PLAN 64 y.o. male POD1 thoracic fusion for unstable T7-8 fracture. Doing well. - okay to mobilize from NS perspective with brace (ordered) - Hold lovenox today. Okay to restart tomorrow

## 2019-12-13 NOTE — Progress Notes (Signed)
Pt continues to refuse cpap.  Rt d/c'd order at this time for multiple refusals.  RT will continue to monitor.

## 2019-12-13 NOTE — Progress Notes (Addendum)
Orthopedic Tech Progress Note Patient Details:  Mark Molina 11-15-1955 [id number] Called in order to [employer] for a [employer] BACK BRACE Patient ID: [name], male   DOB: 11-06-1955, 64 y.o.   MRN: [id number]   [name] 12/13/2019, 8:44 AM

## 2019-12-14 LAB — BASIC METABOLIC PANEL
Anion gap: 11 (ref 5–15)
BUN: 25 mg/dL — ABNORMAL HIGH (ref 8–23)
CO2: 29 mmol/L (ref 22–32)
Calcium: 8.3 mg/dL — ABNORMAL LOW (ref 8.9–10.3)
Chloride: 101 mmol/L (ref 98–111)
Creatinine, Ser: 0.7 mg/dL (ref 0.61–1.24)
GFR calc Af Amer: >60 mL/min (ref >60)
GFR calc non Af Amer: >60 mL/min (ref >60)
Glucose, Bld: 150 mg/dL — ABNORMAL HIGH (ref 70–99)
Potassium: 3.7 mmol/L (ref 3.5–5.1)
Sodium: 141 mmol/L (ref 135–145)

## 2019-12-14 LAB — GLUCOSE, CAPILLARY
Glucose-Capillary: 184 mg/dL — ABNORMAL HIGH (ref 70–99)
Glucose-Capillary: 126 mg/dL — ABNORMAL HIGH (ref 70–99)
Glucose-Capillary: 142 mg/dL — ABNORMAL HIGH (ref 70–99)
Glucose-Capillary: 140 mg/dL — ABNORMAL HIGH (ref 70–99)

## 2019-12-14 LAB — CBC
HCT: 28.2 % — ABNORMAL LOW (ref 39.0–52.0)
Hemoglobin: 8.9 g/dL — ABNORMAL LOW (ref 13.0–17.0)
MCH: 29 pg (ref 26.0–34.0)
MCHC: 31.6 g/dL (ref 30.0–36.0)
MCV: 91.9 fL (ref 80.0–100.0)
Platelets: 301 10*3/uL (ref 150–400)
RBC: 3.07 MIL/uL — ABNORMAL LOW (ref 4.22–5.81)
RDW: 15.1 % (ref 11.5–15.5)
WBC: 10.5 10*3/uL (ref 4.0–10.5)
nRBC: 0 % (ref 0.0–0.2)

## 2019-12-14 MED ORDER — ASPIRIN EC 81 MG PO TBEC
81.0000 mg | DELAYED_RELEASE_TABLET | Freq: Every day | ORAL | Status: DC
  Administered 2019-12-14 – 2019-12-15 (×2): 81 mg via ORAL
  Filled 2019-12-14 (×2): qty 1

## 2019-12-14 MED ORDER — BISACODYL 10 MG RE SUPP
10.0000 mg | Freq: Once | RECTAL | Status: AC
  Administered 2019-12-14: 10 mg via RECTAL
  Filled 2019-12-14: qty 1

## 2019-12-14 MED ORDER — ENOXAPARIN SODIUM 40 MG/0.4ML ~~LOC~~ SOLN
40.0000 mg | SUBCUTANEOUS | Status: DC
  Administered 2019-12-14 – 2019-12-15 (×2): 40 mg via SUBCUTANEOUS
  Filled 2019-12-14 (×2): qty 0.4

## 2019-12-14 MED ORDER — POLYETHYLENE GLYCOL 3350 17 G PO PACK
17.0000 g | PACK | Freq: Every day | ORAL | Status: DC
  Administered 2019-12-14 – 2019-12-15 (×2): 17 g via ORAL
  Filled 2019-12-14 (×3): qty 1

## 2019-12-14 NOTE — Evaluation (Signed)
Occupational Therapy Evaluation Patient Details Name: Mark Molina MRN: [id number] DOB: 1956-02-28 Today's Date: 12/14/2019    History of Present Illness 64 yo s/p [facility] R rib fxs with tiny R PTX, R pulmonary contusion, acute hypoxic respiratory failure, R scapula fx, aortic injury, T6-T9 fusion on 7/12 with brace, PMH CVA 08/2019 HTN DM OSA.   Clinical Impression   PT admitted with [facility] with multiple injuries including T6-T9 fusion. Pt currently with functional limitiations due to the deficits listed below (see OT problem list). Pt currently requires (A) to don brace and balance with basic transfer. Pt will have to be mod I in order to d/c home due to wife also with injuries at this time.  Pt will benefit from skilled OT to increase their independence and safety with adls and balance to allow discharge HHOT.     Follow Up Recommendations  Home health OT    Equipment Recommendations  3 in 1 bedside commode    Recommendations for Other Services       Precautions / Restrictions Precautions Precautions: Fall;Back Precaution Booklet Issued: No Precaution Comments: able to recall back precautions      Mobility Bed Mobility               General bed mobility comments: pt received and left up in recliner. Left with wife in 4NP04, RN aware  Transfers Overall transfer level: Needs assistance Equipment used: None Transfers: Sit to/from Stand Sit to Stand: Mod assist         General transfer comment: requires use of bil UE to power up from chair    Balance Overall balance assessment: Needs assistance Sitting-balance support: No upper extremity supported;Feet supported Sitting balance-Leahy Scale: Fair     Standing balance support: No upper extremity supported Standing balance-Leahy Scale: Poor Standing balance comment: reliant on unilateral UE support or minG for static standing balance                           ADL either performed or assessed with  clinical judgement   ADL Overall ADL's : Needs assistance/impaired Eating/Feeding: Modified independent   Grooming: Wash/dry hands;Min guard;Standing             Upper Body Dressing Details (indicate cue type and reason): able to describe don of clamshell  Lower Body Dressing: Sit to/from stand;Minimal assistance Lower Body Dressing Details (indicate cue type and reason): able to figure 4 cross Toilet Transfer: Moderate assistance             General ADL Comments: Pt in wife room visiting on arrival having finished PT session. pt transfers from standard chair to sink level grooming task. pt demonstrates ability to complete figure 4 dressing so no AE will be required. pt with R UE dominance and now with R ue deficits so future brace education will be required on L side     Vision Patient Visual Report: No change from baseline       Perception     Praxis      Pertinent Vitals/Pain Pain Assessment: Faces Faces Pain Scale: Hurts little more Pain Location: back Pain Descriptors / Indicators: Grimacing Pain Intervention(s): Monitored during session;Repositioned;Premedicated before session     Hand Dominance Right   Extremity/Trunk Assessment Upper Extremity Assessment Upper Extremity Assessment: RUE deficits/detail RUE Deficits / Details: unable to perform shoulder flexion or sustain shoulder flexion once assisted. WFL elbow wrist and hand. pt able to elevate scapula  Lower Extremity Assessment Lower Extremity Assessment: Defer to PT evaluation   Cervical / Trunk Assessment Cervical / Trunk Assessment: Other exceptions Cervical / Trunk Exceptions: clamshell brace   Communication Communication Communication: No difficulties   Cognition Arousal/Alertness: Awake/alert Behavior During Therapy: WFL for tasks assessed/performed Overall Cognitive Status: Impaired/Different from baseline Area of Impairment: Memory                     Memory: Decreased  short-term memory             General Comments  VSS, RA on arrival    Exercises     Shoulder Instructions      Home Living Family/patient expects to be discharged to:: Private residence Living Arrangements: Spouse/significant other Available Help at Discharge: Family;Available 24 hours/day (children) Type of Home: House Home Access: Stairs to enter [employer] of Steps: 4 Entrance Stairs-Rails: None Home Layout: One level     Bathroom Shower/Tub: Producer, television/film/video: Standard     Home Equipment: Environmental consultant - 2 wheels   Additional Comments: wife currently admitted as well      Prior Functioning/Environment Level of Independence: Independent                 OT Problem List: Decreased strength;Decreased activity tolerance;Impaired balance (sitting and/or standing);Decreased safety awareness;Decreased knowledge of use of DME or AE;Decreased knowledge of precautions;Pain      OT Treatment/Interventions: Self-care/ADL training;Therapeutic exercise;DME and/or AE instruction    OT Goals(Current goals can be found in the care plan section) Acute Rehab OT Goals Patient Stated Goal: To go home and return to independent mobility OT Goal Formulation: With patient Time For Goal Achievement: 12/28/19 Potential to Achieve Goals: Good  OT Frequency: Min 3X/week   Barriers to D/C:            Co-evaluation              AM-PAC OT "6 Clicks" Daily Activity     Outcome Measure Help from another person eating meals?: None Help from another person taking care of personal grooming?: A Little Help from another person toileting, which includes using toliet, bedpan, or urinal?: A Lot Help from another person bathing (including washing, rinsing, drying)?: A Lot Help from another person to put on and taking off regular upper body clothing?: A Little Help from another person to put on and taking off regular lower body clothing?: A Little 6 Click  Score: 17   End of Session Equipment Utilized During Treatment: Back brace Nurse Communication: Mobility status;Precautions  Activity Tolerance: Patient tolerated treatment well Patient left: in chair;with call bell/phone within reach;Other (comment) (visiting in wife room)  OT Visit Diagnosis: Unsteadiness on feet (R26.81);Muscle weakness (generalized) (M62.81);Pain                Time: 1020-1049 OT Time Calculation (min): 29 min Charges:  OT General Charges $OT Visit: 1 Visit OT Evaluation $OT Eval Moderate Complexity: 1 Mod OT Treatments $Self Care/Home Management : 8-22 mins   Brynn, OTR/L  Acute Rehabilitation Services Pager: [phone] Office: [phone] .   Mateo Flow 12/14/2019, 4:07 PM

## 2019-12-14 NOTE — Evaluation (Signed)
Physical Therapy Evaluation Patient Details Name: Mark Molina MRN: [id number] DOB: June 17, 1955 Today's Date: 12/14/2019   History of Present Illness  64 yo s/p [facility] R rib fxs with tiny R PTX, R pulmonary contusion, acute hypoxic respiratory failure, R scapula fx, aortic injury, T6-T9 fusion on 7/12 with brace, PMH CVA 08/2019 HTN DM OSA.  Clinical Impression  Pt presents to PT with deficits in functional mobility, gait, balance, endurance, strength, power. Pt is mildly unsteady with transfers and ambulation at this time, requiring minA to maintain balance. Pt also requires minA to power up to standing from low surface and minA to slow descent during transfers. PT provides education on back precautions and brace use for protection of spine after surgery. Pt will benefit form continued aggressive mobilization and acute PT POC to improve mobility quality and to aide in restoring independence.    Follow Up Recommendations Home health PT;Supervision/Assistance - 24 hour    Equipment Recommendations  None recommended by PT (own RW, wife receiving BSC)    Recommendations for Other Services       Precautions / Restrictions Precautions Precautions: Fall;Back Precaution Booklet Issued: No Precaution Comments: PT verbally reviews back precautions Restrictions Weight Bearing Restrictions: No      Mobility  Bed Mobility               General bed mobility comments: pt received and left up in recliner. Left with wife in 4NP04, RN aware  Transfers Overall transfer level: Needs assistance Equipment used: None Transfers: Sit to/from Stand Sit to Stand: Min assist            Ambulation/Gait Ambulation/Gait assistance: Min assist Gait Distance (Feet): 100 Feet Assistive device:  (intermittent use of railing) Gait Pattern/deviations: Step-to pattern;Wide base of support;Drifts right/left Gait velocity: reduced Gait velocity interpretation: <1.8 ft/sec, indicate of risk for  recurrent falls General Gait Details: pt with increased lateral sway, multiple minor losses of balance requiring minA to correct  Stairs            Wheelchair Mobility    Modified Rankin (Stroke Patients Only)       Balance Overall balance assessment: Needs assistance Sitting-balance support: No upper extremity supported;Feet supported Sitting balance-Leahy Scale: Fair     Standing balance support: No upper extremity supported Standing balance-Leahy Scale: Poor Standing balance comment: reliant on unilateral UE support or minG for static standing balance                             Pertinent Vitals/Pain Pain Assessment: Faces Faces Pain Scale: Hurts even more Pain Location: back Pain Descriptors / Indicators: Grimacing Pain Intervention(s): Monitored during session    Home Living Family/patient expects to be discharged to:: Private residence Living Arrangements: Spouse/significant other Available Help at Discharge: Family;Available 24 hours/day (children) Type of Home: House Home Access: Stairs to enter Entrance Stairs-Rails: None Entrance Stairs-Number of Steps: 4 Home Layout: One level Home Equipment: Walker - 2 wheels      Prior Function Level of Independence: Independent               Hand Dominance   Dominant Hand: Right    Extremity/Trunk Assessment   Upper Extremity Assessment Upper Extremity Assessment: Overall WFL for tasks assessed    Lower Extremity Assessment Lower Extremity Assessment: Generalized weakness    Cervical / Trunk Assessment Cervical / Trunk Assessment: Other exceptions Cervical / Trunk Exceptions: clamshell brace  Communication  Communication: No difficulties  Cognition Arousal/Alertness: Awake/alert Behavior During Therapy: WFL for tasks assessed/performed Overall Cognitive Status: Within Functional Limits for tasks assessed                                        General Comments  General comments (skin integrity, edema, etc.): pt mildly tachy into 110s this session, SpO2 stable, pt denies SOB    Exercises     Assessment/Plan    PT Assessment Patient needs continued PT services  PT Problem List Decreased strength;Decreased activity tolerance;Decreased balance;Decreased mobility;Decreased knowledge of use of DME;Decreased safety awareness;Decreased knowledge of precautions       PT Treatment Interventions DME instruction;Gait training;Stair training;Functional mobility training;Therapeutic activities;Therapeutic exercise;Balance training;Neuromuscular re-education;Cognitive remediation;Patient/family education    PT Goals (Current goals can be found in the Care Plan section)  Acute Rehab PT Goals Patient Stated Goal: To go home and return to independent mobility PT Goal Formulation: With patient Time For Goal Achievement: 12/28/19 Potential to Achieve Goals: Good    Frequency Min 5X/week   Barriers to discharge        Co-evaluation               AM-PAC PT "6 Clicks" Mobility  Outcome Measure Help needed turning from your back to your side while in a flat bed without using bedrails?: A Little Help needed moving from lying on your back to sitting on the side of a flat bed without using bedrails?: A Little Help needed moving to and from a bed to a chair (including a wheelchair)?: A Little Help needed standing up from a chair using your arms (e.g., wheelchair or bedside chair)?: A Little Help needed to walk in hospital room?: A Little Help needed climbing 3-5 steps with a railing? : A Lot 6 Click Score: 17    End of Session Equipment Utilized During Treatment: Back brace Activity Tolerance: Patient tolerated treatment well Patient left: in chair;with family/visitor present (pt left in 4NP04 with spouse) Nurse Communication: Mobility status PT Visit Diagnosis: Other abnormalities of gait and mobility (R26.89);Unsteadiness on feet (R26.81);Muscle  weakness (generalized) (M62.81);Pain Pain - Right/Left:  (back)    Time: 0944-1000 PT Time Calculation (min) (ACUTE ONLY): 16 min   Charges:   PT Evaluation $PT Eval Moderate Complexity: 1 Mod          Arlyss Gandy, PT, DPT Acute Rehabilitation Pager: [phone]   Arlyss Gandy 12/14/2019, 10:33 AM

## 2019-12-14 NOTE — Progress Notes (Signed)
  NEUROSURGERY PROGRESS NOTE   No issues overnight. Pt reports a bowel movement last night.  Back appropriate sore Denies new N/T/W Pt reports getting his brace yesterday but has not seen therapy at this point.  Pt reports that his biggest concern is with the pain in his left shoulder.   EXAM:  BP 125/74   Pulse 97   Temp 98.4 F (36.9 C)   Resp 12   Ht 6' (1.829 m)   Wt 116.1 kg   SpO2 97%   BMI 34.71 kg/m   Awake, alert, oriented  Speech fluent, appropriate  CN grossly intact  5/5 BLE Incision: dried blood, no active bleeding  IMPRESSION/PLAN 64 y.o. male POD2 thoracic fusion for unstable Tspine fracture. Doing well. - PT/OT. Brace when upright and OOB - Okay to resume lovenox. Okay for ASA per vascular - new no NS recs

## 2019-12-14 NOTE — Progress Notes (Addendum)
2 Days Post-Op  Subjective: CC: Doing well. Down to 2L o2. No sob. Pulling 1250 on IS. Notes n/v has resolved. He began passing a large amount of flatus frequently yesterday. Loose BM this am, nurse reports this was small. No abdominal pain. Distension has improved. Less burping/belching. Did not get oob yesterday. Pain well controlled overall. Currently a 2/10. Some tachycardia to 110 overnight. Currently 80's on monitor. Foley out yesterday. Voiding with condom cath.   Objective: Vital signs in last 24 hours: Temp:  [98.6 F (37 C)-99.1 F (37.3 C)] 98.7 F (37.1 C) (07/14 0405) Pulse Rate:  [86-134] 108 (07/14 0405) Resp:  [16-27] 16 (07/14 0405) BP: (120-156)/(59-114) 135/70 (07/14 0405) SpO2:  [93 %-97 %] 96 % (07/14 0405) Last BM Date: 12/07/19  Intake/Output from previous day: 07/13 0701 - 07/14 0700 In: 654.9 [P.O.:480; I.V.:74.9; IV Piggyback:100] Out: 650 [Urine:650] Intake/Output this shift: No intake/output data recorded.  PE: Gen:  Alert, NAD, pleasant HEENT: EOM's intact, pupils equal and round Card:  A fib. Rate in 80's Pulm:  CTAB, no W/R/R, effort normal. Pulling 1250 on IS. On 2L Abd: Soft, mild distension, NT, normoactive BS Ext:  No LE edema. DP 2+ b/l Psych: A&Ox3  Skin: no rashes noted, warm and dry  Lab Results:  Recent Labs    12/13/19 0511 12/14/19 0432  WBC 12.2* 10.5  HGB 8.7* 8.9*  HCT 27.2* 28.2*  PLT 294 301   BMET Recent Labs    12/13/19 0511 12/14/19 0432  NA 139 141  K 4.1 3.7  CL 101 101  CO2 27 29  GLUCOSE 152* 150*  BUN 26* 25*  CREATININE 0.88 0.70  CALCIUM 8.6* 8.3*   PT/INR No results for input(s): LABPROT, INR in the last 72 hours. CMP     Component Value Date/Time   NA 141 12/14/2019 0432   K 3.7 12/14/2019 0432   CL 101 12/14/2019 0432   CO2 29 12/14/2019 0432   GLUCOSE 150 (H) 12/14/2019 0432   BUN 25 (H) 12/14/2019 0432   CREATININE 0.70 12/14/2019 0432   CALCIUM 8.3 (L) 12/14/2019 0432   PROT 6.8  12/07/2019 1511   ALBUMIN 3.6 12/07/2019 1511   AST 48 (H) 12/07/2019 1511   ALT 65 (H) 12/07/2019 1511   ALKPHOS 80 12/07/2019 1511   BILITOT 1.2 12/07/2019 1511   GFRNONAA >60 12/14/2019 0432   GFRAA >60 12/14/2019 0432   Lipase  No results found for: LIPASE     Studies/Results: DG Thoracic Spine 2 View  Result Date: 12/12/2019 CLINICAL DATA:  T6-T9 fusion. EXAM: THORACIC SPINE 2 VIEWS; DG C-ARM 1-60 MIN COMPARISON:  None. FINDINGS: Two fluoroscopic spot views obtained in the lumbar spine in frontal and lateral projections. Posterior rod with intrapedicular screws, levels difficult to delineate on the provided: Views. Aortic stent graft in place. Total fluoroscopy time 2 minutes 18 seconds. IMPRESSION: Fluoroscopic spot views after thoracic fusion. Electronically Signed   By: Narda Rutherford M.D.   On: 12/12/2019 17:50   DG Shoulder Right  Result Date: 12/13/2019 CLINICAL DATA:  Scapular fracture EXAM: RIGHT SHOULDER - 2+ VIEW COMPARISON:  12/07/2019 FINDINGS: Fracture of the body of the scapula is again identified. This was better evaluated on prior CT. No new displacement. No new abnormality at the right glenohumeral joint. Right rib fractures. Partially imaged thoracic spine fusion. IMPRESSION: Fracture of the body of the scapula without new displacement. Electronically Signed   By: Guadlupe Spanish M.D.   On:  12/13/2019 11:43   DG CHEST PORT 1 VIEW  Result Date: 12/12/2019 CLINICAL DATA:  64 year old male with respiratory failure. NG placement. EXAM: PORTABLE CHEST 1 VIEW COMPARISON:  Chest radiograph dated 12/08/2019 and CT dated 12/10/2019. FINDINGS: An enteric tube is noted extending below the diaphragm with tip beyond the inferior margin of the image. There is mild cardiomegaly and vascular congestion. Bibasilar densities may represent atelectasis/congestion although pneumonia is not excluded. Clinical correlation is recommended. No significant pleural effusion or pneumothorax.  Postsurgical changes of endovascular stent graft repair of the descending thoracic aorta and midthoracic spine fusion hardware. Right posterior rib fractures IMPRESSION: 1. Enteric tube with tip beyond the inferior margin of the image. 2. Mild cardiomegaly and vascular congestion. 3. Bibasilar atelectasis versus infiltrate. 4. Postsurgical changes of endovascular stent graft repair of the descending thoracic aorta and thoracic spine fusion. 5. Multiple posterior right rib fractures. Electronically Signed   By: Elgie Collard M.D.   On: 12/12/2019 20:55   DG Abd Portable 1V  Result Date: 12/12/2019 CLINICAL DATA:  Enteric catheter placement, aortic stent graft and thoracic spinal fusion after trauma EXAM: PORTABLE ABDOMEN - 1 VIEW COMPARISON:  12/10/2019 FINDINGS: Frontal view of the lower chest and upper abdomen demonstrates tip and side port of an enteric catheter projecting over the gastric body. Mild gaseous distention of the bowel likely reflects ileus. Partial visualization of midthoracic spinal fusion. Endoluminal stent graft is identified within the visualized thoracoabdominal aorta. IMPRESSION: 1. Enteric catheter overlying the gastric body. Electronically Signed   By: Sharlet Salina M.D.   On: 12/12/2019 20:55   DG C-Arm 1-60 Min  Result Date: 12/12/2019 CLINICAL DATA:  T6-T9 fusion. EXAM: THORACIC SPINE 2 VIEWS; DG C-ARM 1-60 MIN COMPARISON:  None. FINDINGS: Two fluoroscopic spot views obtained in the lumbar spine in frontal and lateral projections. Posterior rod with intrapedicular screws, levels difficult to delineate on the provided: Views. Aortic stent graft in place. Total fluoroscopy time 2 minutes 18 seconds. IMPRESSION: Fluoroscopic spot views after thoracic fusion. Electronically Signed   By: Narda Rutherford M.D.   On: 12/12/2019 17:50    Anti-infectives: Anti-infectives (From admission, onward)   Start     Dose/Rate Route Frequency Ordered Stop   12/12/19 1300  ceFAZolin (ANCEF)  IVPB 2g/100 mL premix        2 g 200 mL/hr over 30 Minutes Intravenous  Once 12/12/19 1247 12/12/19 1339   12/12/19 1249  ceFAZolin (ANCEF) 2-4 GM/100ML-% IVPB       Note to Pharmacy: Shireen Quan   : cabinet override      12/12/19 1249 12/13/19 0059   12/12/19 1100  ceFAZolin (ANCEF) 3 g in dextrose 5 % 50 mL IVPB       Note to Pharmacy: Send with pt to OR   3 g 100 mL/hr over 30 Minutes Intravenous On call to O.R. 12/10/19 2237 12/11/19 0850   12/11/19 1700  ceFAZolin (ANCEF) IVPB 2g/100 mL premix        2 g 200 mL/hr over 30 Minutes Intravenous Every 8 hours 12/11/19 1033 12/12/19 0024   12/11/19 0000  ceFAZolin (ANCEF) IVPB 1 g/50 mL premix  Status:  Discontinued       Note to Pharmacy: Send with pt to OR   1 g 100 mL/hr over 30 Minutes Intravenous On call 12/10/19 1835 12/10/19 2237       Assessment/Plan [facility] Multiple R rib fxs with tiny R H/PNX - multimodal pain control and pulm toilet, pulled 1250  on IS, CXR 7/12 on PTX R pulm contusion- IS/pulm toilet, chest PT, PRN nebs.  Acute hypoxic respiratory failure - Improving. On 2L O2 this AM. Wean O2. 2/2 above and aspirated on induction 7/12 R scapula fx -Ortho c/s (Dr. August Saucer), likely non-operative, sling when OOB Aortic injury- VVS c/s (Dr. Myra Gianotti) s/p TEVAR 7/11 Unstable T7/8 fracture- S/P T6-T9 fusion by Dr. Conchita Paris 7/12, brace. PT/OT  Road rash - local wound care ABL anemia - Hgb stable at 8.9 Recent stroke 08/2019- no residual weakness, on eliquis (holding) HTN- home meds restarted GERD - Protonix IV Q12, exacerbated by ileus  DM- SSI Hx of A. Fib - On tele. Home Cardizem and Metoprolol  OSA - uses CPAP at home, resumed ID - None currently. WBC 10.5, afebrile  VTE -SCDs, restart Lovenox today. Eliquis currently on hold.  FEN -ileus improving, adv to FLD, bowel regimen Foley - Out Dispo-PT/OT   LOS: 7 days    Jacinto Halim , [facility] Surgery 12/14/2019, 8:05 AM Please see Amion for  pager number during day hours 7:00am-4:30pm

## 2019-12-15 LAB — BPAM RBC
Blood Product Expiration Date: 202108082359
Blood Product Expiration Date: 202108082359
ISSUE DATE / TIME: 202107121447
ISSUE DATE / TIME: 202107121447
Unit Type and Rh: 5100
Unit Type and Rh: 5100

## 2019-12-15 LAB — CBC
HCT: 27 % — ABNORMAL LOW (ref 39.0–52.0)
Hemoglobin: 8.8 g/dL — ABNORMAL LOW (ref 13.0–17.0)
MCH: 30.2 pg (ref 26.0–34.0)
MCHC: 32.6 g/dL (ref 30.0–36.0)
MCV: 92.8 fL (ref 80.0–100.0)
Platelets: 337 10*3/uL (ref 150–400)
RBC: 2.91 MIL/uL — ABNORMAL LOW (ref 4.22–5.81)
RDW: 14.9 % (ref 11.5–15.5)
WBC: 9.8 10*3/uL (ref 4.0–10.5)
nRBC: 0 % (ref 0.0–0.2)

## 2019-12-15 LAB — TYPE AND SCREEN
ABO/RH(D): O POS
Antibody Screen: NEGATIVE
Unit division: 0
Unit division: 0

## 2019-12-15 LAB — GLUCOSE, CAPILLARY
Glucose-Capillary: 144 mg/dL — ABNORMAL HIGH (ref 70–99)
Glucose-Capillary: 146 mg/dL — ABNORMAL HIGH (ref 70–99)
Glucose-Capillary: 143 mg/dL — ABNORMAL HIGH (ref 70–99)
Glucose-Capillary: 129 mg/dL — ABNORMAL HIGH (ref 70–99)

## 2019-12-15 LAB — HEMOGLOBIN A1C
Hgb A1c MFr Bld: 6.3 % — ABNORMAL HIGH (ref 4.8–5.6)
Mean Plasma Glucose: 134.11 mg/dL

## 2019-12-15 LAB — TSH: TSH: 0.722 u[IU]/mL (ref 0.350–4.500)

## 2019-12-15 MED ORDER — DILTIAZEM HCL 25 MG/5ML IV SOLN
10.0000 mg | Freq: Once | INTRAVENOUS | Status: AC
  Administered 2019-12-15: 10 mg via INTRAVENOUS
  Filled 2019-12-15: qty 5

## 2019-12-15 MED ORDER — DILTIAZEM HCL ER COATED BEADS 120 MG PO CP24
120.0000 mg | ORAL_CAPSULE | Freq: Once | ORAL | Status: AC
  Administered 2019-12-15: 120 mg via ORAL
  Filled 2019-12-15: qty 1

## 2019-12-15 MED ORDER — METOPROLOL TARTRATE 5 MG/5ML IV SOLN
5.0000 mg | Freq: Once | INTRAVENOUS | Status: AC
  Administered 2019-12-15: 5 mg via INTRAVENOUS
  Filled 2019-12-15: qty 5

## 2019-12-15 MED ORDER — APIXABAN 5 MG PO TABS
5.0000 mg | ORAL_TABLET | Freq: Two times a day (BID) | ORAL | Status: DC
  Administered 2019-12-15 – 2019-12-16 (×3): 5 mg via ORAL
  Filled 2019-12-15 (×3): qty 1

## 2019-12-15 MED ORDER — DILTIAZEM HCL ER COATED BEADS 120 MG PO CP24
240.0000 mg | ORAL_CAPSULE | Freq: Every day | ORAL | Status: DC
  Administered 2019-12-16: 240 mg via ORAL
  Filled 2019-12-15: qty 2

## 2019-12-15 MED ORDER — METOPROLOL SUCCINATE ER 100 MG PO TB24: 100.0000 mg | ORAL_TABLET | Freq: Every day | ORAL | Status: DC

## 2019-12-15 MED ORDER — POLYETHYLENE GLYCOL 3350 17 G PO PACK
17.0000 g | PACK | Freq: Two times a day (BID) | ORAL | Status: DC
  Administered 2019-12-15 – 2019-12-16 (×2): 17 g via ORAL
  Filled 2019-12-15 (×2): qty 1

## 2019-12-15 NOTE — Progress Notes (Signed)
Physical Therapy Treatment Patient Details Name: Mark Molina MRN: [id number] DOB: Mar 31, 1956 Today's Date: 12/15/2019    History of Present Illness 64 yo s/p [facility] R rib fxs with tiny R PTX, R pulmonary contusion, acute hypoxic respiratory failure, R scapula fx, aortic injury, T6-T9 fusion on 7/12 with brace, PMH CVA 08/2019 HTN DM OSA.    PT Comments    Pt tolerates treatment well with improved activity tolerance. Pt demonstrates reduced assistance requirements during transfers and demonstrates improved balance with UE support of RW. Pt will benefit from continued acute PT POC and aggressive mobilization to improve activity tolerance, power, and dynamic balance. PT continues to recommend HHPT, no current DME needs.   Follow Up Recommendations  Home health PT;Supervision/Assistance - 24 hour     Equipment Recommendations  None recommended by PT (pt owns a RW per spouse)    Recommendations for Other Services       Precautions / Restrictions Precautions Precautions: Fall;Back Precaution Booklet Issued: No Precaution Comments: able to recall 2/3 back precautions without cues and 3/3 with minimal cueing Restrictions Weight Bearing Restrictions: No    Mobility  Bed Mobility               General bed mobility comments: pt received and left in recliner  Transfers Overall transfer level: Needs assistance Equipment used: Rolling walker (2 wheeled) Transfers: Sit to/from Stand Sit to Stand: Min guard            Ambulation/Gait Ambulation/Gait assistance: Min Emergency planning/management officer (Feet): 250 Feet Assistive device: Rolling walker (2 wheeled) Gait Pattern/deviations: Step-to pattern Gait velocity: reduced Gait velocity interpretation: <1.8 ft/sec, indicate of risk for recurrent falls General Gait Details: pt with shortened step length and slowed gait speed, no significant balance deviations   Stairs Stairs: Yes Stairs assistance: Min guard Stair  Management: No rails Number of Stairs: 4     Wheelchair Mobility    Modified Rankin (Stroke Patients Only)       Balance Overall balance assessment: Needs assistance Sitting-balance support: No upper extremity supported;Feet supported Sitting balance-Leahy Scale: Fair     Standing balance support: Bilateral upper extremity supported Standing balance-Leahy Scale: Poor Standing balance comment: reliant on UE support to maintain balance                            Cognition Arousal/Alertness: Awake/alert Behavior During Therapy: WFL for tasks assessed/performed Overall Cognitive Status: Within Functional Limits for tasks assessed                                        Exercises      General Comments General comments (skin integrity, edema, etc.): pt tachy during mobility with peak of 157 noted, typically ranging from 100-130s with mobility      Pertinent Vitals/Pain Pain Assessment: Faces Faces Pain Scale: Hurts little more Pain Location: back Pain Descriptors / Indicators: Grimacing Pain Intervention(s): Monitored during session    Home Living                      Prior Function            PT Goals (current goals can now be found in the care plan section) Acute Rehab PT Goals Patient Stated Goal: To go home and return to independent mobility Progress towards PT goals: Progressing toward  goals    Frequency    Min 5X/week      PT Plan Current plan remains appropriate    Co-evaluation              AM-PAC PT "6 Clicks" Mobility   Outcome Measure  Help needed turning from your back to your side while in a flat bed without using bedrails?: A Little Help needed moving from lying on your back to sitting on the side of a flat bed without using bedrails?: A Little Help needed moving to and from a bed to a chair (including a wheelchair)?: A Little Help needed standing up from a chair using your arms (e.g., wheelchair  or bedside chair)?: A Little Help needed to walk in hospital room?: A Little Help needed climbing 3-5 steps with a railing? : A Little 6 Click Score: 18    End of Session Equipment Utilized During Treatment: Back brace Activity Tolerance: Patient tolerated treatment well Patient left: in chair Nurse Communication: Mobility status PT Visit Diagnosis: Other abnormalities of gait and mobility (R26.89);Unsteadiness on feet (R26.81);Muscle weakness (generalized) (M62.81);Pain Pain - part of body:  (back)     Time: 1696-7893 PT Time Calculation (min) (ACUTE ONLY): 32 min  Charges:  $Gait Training: 8-22 mins $Therapeutic Activity: 8-22 mins                     Arlyss Gandy, PT, DPT Acute Rehabilitation Pager: [phone]    Arlyss Gandy 12/15/2019, 9:25 AM

## 2019-12-15 NOTE — Progress Notes (Signed)
  NEUROSURGERY PROGRESS NOTE   Afib with RVR yesterday - Cards consulted by trauma Minimal back pain No new N/T/W in extremities Able to ambulate with minimal difficulties  EXAM:  BP 129/68   Pulse 94   Temp 97.8 F (36.6 C) (Oral)   Resp 16   Ht 6' (1.829 m)   Wt 116.1 kg   SpO2 93%   BMI 34.71 kg/m   Awake, alert, oriented  Speech fluent, appropriate  CN grossly intact  5/5 BLE  Incision: dried blood  IMPRESSION/PLAN 64 y.o. male POD3 thoracic fusion for unstable Tspine fracture. Doing well.  - Resumed eliquis today.  - continue to monitor exam and report any change - no new NS recs

## 2019-12-15 NOTE — Progress Notes (Addendum)
3 Days Post-Op  Subjective: CC: Patient noted to have a fib with RVR overnight. Asymptomatic. No CP, SOB, or palpitations.  Pain well controlled with oral medications. Off o2. Tolerating fld but not eating much as not happy with selection. Continues to pass flatus. No further BM's since yesterday morning. Did well with therapies, walking 259ft with RW and able to do stairs x 4 with no assist of rails.   Objective: Vital signs in last 24 hours: Temp:  [98 F (36.7 C)-98.6 F (37 C)] 98.2 F (36.8 C) (07/15 0740) Pulse Rate:  [91-113] 106 (07/15 0740) Resp:  [13-19] 19 (07/15 0740) BP: (105-149)/(63-80) 149/80 (07/15 0740) SpO2:  [91 %-97 %] 94 % (07/15 0740) Last BM Date: 12/07/19  Intake/Output from previous day: 07/14 0701 - 07/15 0700 In: 1100 [P.O.:1100] Out: 1350 [Urine:1350] Intake/Output this shift: Total I/O In: 550 [P.O.:550] Out: -   PE: Gen:  Alert, NAD, pleasant. OOB in chair.  Card:  Irr, irr Pulm:  CTAB, no W/R/R, effort normal. On RA Abd: Soft, mild distension, NT, normoactive BS. Clamshell brace on Ext:  No LE edema. DP 2+ b/l Psych: A&Ox3  Skin: no rashes noted, warm and dry  Lab Results:  Recent Labs    12/14/19 0432 12/15/19 0438  WBC 10.5 9.8  HGB 8.9* 8.8*  HCT 28.2* 27.0*  PLT 301 337   BMET Recent Labs    12/13/19 0511 12/14/19 0432  NA 139 141  K 4.1 3.7  CL 101 101  CO2 27 29  GLUCOSE 152* 150*  BUN 26* 25*  CREATININE 0.88 0.70  CALCIUM 8.6* 8.3*   PT/INR No results for input(s): LABPROT, INR in the last 72 hours. CMP     Component Value Date/Time   NA 141 12/14/2019 0432   K 3.7 12/14/2019 0432   CL 101 12/14/2019 0432   CO2 29 12/14/2019 0432   GLUCOSE 150 (H) 12/14/2019 0432   BUN 25 (H) 12/14/2019 0432   CREATININE 0.70 12/14/2019 0432   CALCIUM 8.3 (L) 12/14/2019 0432   PROT 6.8 12/07/2019 1511   ALBUMIN 3.6 12/07/2019 1511   AST 48 (H) 12/07/2019 1511   ALT 65 (H) 12/07/2019 1511   ALKPHOS 80 12/07/2019  1511   BILITOT 1.2 12/07/2019 1511   GFRNONAA >60 12/14/2019 0432   GFRAA >60 12/14/2019 0432   Lipase  No results found for: LIPASE     Studies/Results: DG Shoulder Right  Result Date: 12/13/2019 CLINICAL DATA:  Scapular fracture EXAM: RIGHT SHOULDER - 2+ VIEW COMPARISON:  12/07/2019 FINDINGS: Fracture of the body of the scapula is again identified. This was better evaluated on prior CT. No new displacement. No new abnormality at the right glenohumeral joint. Right rib fractures. Partially imaged thoracic spine fusion. IMPRESSION: Fracture of the body of the scapula without new displacement. Electronically Signed   By: Guadlupe Spanish M.D.   On: 12/13/2019 11:43    Anti-infectives: Anti-infectives (From admission, onward)   Start     Dose/Rate Route Frequency Ordered Stop   12/12/19 1300  ceFAZolin (ANCEF) IVPB 2g/100 mL premix        2 g 200 mL/hr over 30 Minutes Intravenous  Once 12/12/19 1247 12/12/19 1339   12/12/19 1249  ceFAZolin (ANCEF) 2-4 GM/100ML-% IVPB       Note to Pharmacy: Shireen Quan   : cabinet override      12/12/19 1249 12/13/19 0059   12/12/19 1100  ceFAZolin (ANCEF) 3 g in dextrose 5 %  50 mL IVPB       Note to Pharmacy: Send with pt to OR   3 g 100 mL/hr over 30 Minutes Intravenous On call to O.R. 12/10/19 2237 12/11/19 0850   12/11/19 1700  ceFAZolin (ANCEF) IVPB 2g/100 mL premix        2 g 200 mL/hr over 30 Minutes Intravenous Every 8 hours 12/11/19 1033 12/12/19 0024   12/11/19 0000  ceFAZolin (ANCEF) IVPB 1 g/50 mL premix  Status:  Discontinued       Note to Pharmacy: Send with pt to OR   1 g 100 mL/hr over 30 Minutes Intravenous On call 12/10/19 1835 12/10/19 2237       Assessment/Plan [facility] Multiple R rib fxs with tiny R H/PNX - multimodal pain control and pulm toilet, pulling 1250 on IS, CXR 7/12 on PTX R pulm contusion- IS/pulm toilet, chest PT, PRN nebs.  Acute hypoxic respiratory failure- Improving. Off o2. Did have aspiration on induction  7/12 R scapula fx -Ortho c/s (Dr. August Saucer), likely non-operative, sling when OOB Aortic injury- VVS c/s (Dr. Myra Gianotti) s/p TEVAR 7/11 Unstable T7/8 fracture-S/P T6-T9 fusion by Dr. Conchita Paris 7/12, brace. PT/OT  Road rash - local wound care ABL anemia - Hgb stable at 8.8 Recent stroke 08/2019- no residual weakness, on eliquis (holding) HTN- home meds restarted GERD- Protonix IV Q12, exacerbated by ileus DM- SSI Hx of A. Fib - On tele. Went into A.Fib with RVR overnight. Required Cardizem push. HR still between 100-130 on tele. Will give 5mg  of Metoprolol now. I have contacted on call Cardiologist, Dr. for consultation. His primary cardiologist as an outpatient is Dr. Algie Coffer of [facility] Continue home Cardizem and Metoprolol  OSA - uses CPAP at home, resumed ID - None currently. WBC 9.8, afebrile  VTE -SCDs, restart Lovenox today. Eliquis currently on hold. Will ask NSGY today when this can be restarted. FEN -Adv to soft diet, bowel regimen Foley - Out Dispo-PT/OT recommending HH. Patient has family members that can stay with him 24/7 at discharge. Cards consult.    LOS: 8 days    [facility] , [facility] Surgery 12/15/2019, 10:50 AM Please see Amion for pager number during day hours 7:00am-4:30pm

## 2019-12-15 NOTE — Progress Notes (Addendum)
Occupational Therapy Treatment Patient Details Name: Mark Molina MRN: [id number] DOB: March 01, 1956 Today's Date: 12/15/2019    History of present illness 64 yo s/p [facility] R rib fxs with tiny R PTX, R pulmonary contusion, acute hypoxic respiratory failure, R scapula fx, aortic injury, T6-T9 fusion on 7/12 with brace, PMH CVA 08/2019 HTN DM OSA.   OT comments  Pt making good progress towards OT goals this session. Pt continue to present with decreased ability to recall back precautions, pain and tachycardia with mobility with HR increasing to 139 bpm with mobility. Pt on RA during session with O2 briefly dropping to 88%.Education provided on maintaining back precautions during ADLs with pt verbalizing understanding. Pt able to complete functional mobility with RW and MIN A to wifes room next door. Pt would continue to benefit from skilled occupational therapy while admitted and after d/c to address the below listed limitations in order to improve overall functional mobility and facilitate independence with BADL participation. DC plan remains appropriate, will follow acutely per POC.     Follow Up Recommendations  Home health OT    Equipment Recommendations  3 in 1 bedside commode    Recommendations for Other Services      Precautions / Restrictions Precautions Precautions: Fall;Back Precaution Booklet Issued: No Precaution Comments: pt able to state BLT at start of session but unable to recall what they stand for. able to recall 2/3 at end of session Restrictions Weight Bearing Restrictions: No Other Position/Activity Restrictions: TLSO       Mobility Bed Mobility               General bed mobility comments: pt received and left in recliner in wifes room in [id number]  Transfers Overall transfer level: Needs assistance Equipment used: Rolling walker (2 wheeled) Transfers: Sit to/from Stand Sit to Stand: Min guard         General transfer comment: requires use of bil UE to  power up from chair, min guard for safety    Balance Overall balance assessment: Needs assistance Sitting-balance support: No upper extremity supported;Feet supported Sitting balance-Leahy Scale: Fair     Standing balance support: Bilateral upper extremity supported Standing balance-Leahy Scale: Poor Standing balance comment: reliant on UE support to maintain balance                           ADL either performed or assessed with clinical judgement   ADL Overall ADL's : Needs assistance/impaired       Grooming Details (indicate cue type and reason): education provided on compensatory methods related to maintaining back precautions           Upper Body Dressing Details (indicate cue type and reason): TLSO already donned Lower Body Dressing: Supervision/safety;Sitting/lateral leans Lower Body Dressing Details (indicate cue type and reason): pt able to figure four from recliner to adjust socks Toilet Transfer: Minimal assistance;RW;Ambulation Toilet Transfer Details (indicate cue type and reason): simulated via functional mobility with RW   Toileting - Clothing Manipulation Details (indicate cue type and reason): education provided on compensatory methods related to pericare     Functional mobility during ADLs: Minimal assistance;Rolling walker General ADL Comments: continued education needed on carryover of back precautions     Vision       Perception     Praxis      Cognition Arousal/Alertness: Awake/alert Behavior During Therapy: WFL for tasks assessed/performed Overall Cognitive Status: Within Functional Limits for tasks assessed Area  of Impairment: Memory                     Memory: Decreased short-term memory (unable to state correct precautions despite education during session)         General Comments: pt wanting to go visit wife        Exercises     Shoulder Instructions       General Comments pt tachy during mobility HR  increase to 130 bpm with mobility, pt on RA with O2 dropping briefly to 88%    Pertinent Vitals/ Pain       Pain Assessment: Faces Faces Pain Scale: Hurts a little bit Pain Location: back with mobility Pain Descriptors / Indicators: Grimacing Pain Intervention(s): Limited activity within patient's tolerance;Monitored during session;Repositioned  Home Living                                          Prior Functioning/Environment              Frequency  Min 3X/week        Progress Toward Goals  OT Goals(current goals can now be found in the care plan section)  Progress towards OT goals: Progressing toward goals  Acute Rehab OT Goals Patient Stated Goal: To go home and return to independent mobility OT Goal Formulation: With patient Time For Goal Achievement: 12/28/19 Potential to Achieve Goals: Good  Plan Discharge plan remains appropriate;Frequency remains appropriate    Co-evaluation                 AM-PAC OT "6 Clicks" Daily Activity     Outcome Measure   Help from another person eating meals?: None Help from another person taking care of personal grooming?: A Little Help from another person toileting, which includes using toliet, bedpan, or urinal?: A Lot Help from another person bathing (including washing, rinsing, drying)?: A Lot Help from another person to put on and taking off regular upper body clothing?: A Little Help from another person to put on and taking off regular lower body clothing?: A Little 6 Click Score: 17    End of Session Equipment Utilized During Treatment: Rolling walker;Back brace  OT Visit Diagnosis: Unsteadiness on feet (R26.81);Muscle weakness (generalized) (M62.81);Pain   Activity Tolerance Patient tolerated treatment well   Patient Left in chair;with chair alarm set;Other (comment) (sitting in recliner in wifes room in 4NP04; RN aware)   Nurse Communication Mobility status        Time: 548 [id number] 2052 OT  Time Calculation (min): 20 min  Charges: OT General Charges $OT Visit: 1 Visit OT Treatments $Self Care/Home Management : 8-22 mins  Audery Amel., COTA/L Acute Rehabilitation Services [phone]    Angelina Pih 12/15/2019, 9:51 AM

## 2019-12-15 NOTE — Progress Notes (Addendum)
  Progress Note    12/15/2019 7:49 AM 3 Days Post-Op  Subjective:  No complaints this morning.  Patient would like to get out of bed this morning.   Vitals:   12/15/19 0411 12/15/19 0740  BP: 126/64 (!) 149/80  Pulse: (!) 113 (!) 106  Resp: 18 19  Temp: 98.4 F (36.9 C) 98.2 F (36.8 C)  SpO2: 92% 94%   Physical Exam: Lungs:  Non labored Incisions:  L groin cath site without hematoma Extremities:  Symmetrical radial and DP pulses Neurologic: A&O; limited mobility of R UE  CBC    Component Value Date/Time   WBC 9.8 12/15/2019 0438   RBC 2.91 (L) 12/15/2019 0438   HGB 8.8 (L) 12/15/2019 0438   HCT 27.0 (L) 12/15/2019 0438   PLT 337 12/15/2019 0438   MCV 92.8 12/15/2019 0438   MCH 30.2 12/15/2019 0438   MCHC 32.6 12/15/2019 0438   RDW 14.9 12/15/2019 0438   LYMPHSABS 3.1 12/07/2019 1511   MONOABS 1.0 12/07/2019 1511   EOSABS 0.1 12/07/2019 1511   BASOSABS 0.0 12/07/2019 1511    BMET    Component Value Date/Time   NA 141 12/14/2019 0432   K 3.7 12/14/2019 0432   CL 101 12/14/2019 0432   CO2 29 12/14/2019 0432   GLUCOSE 150 (H) 12/14/2019 0432   BUN 25 (H) 12/14/2019 0432   CREATININE 0.70 12/14/2019 0432   CALCIUM 8.3 (L) 12/14/2019 0432   GFRNONAA >60 12/14/2019 0432   GFRAA >60 12/14/2019 0432    INR    Component Value Date/Time   INR 1.2 12/11/2019 0259     Intake/Output Summary (Last 24 hours) at 12/15/2019 0749 Last data filed at 12/15/2019 0552 Gross per 24 hour  Intake 1100 ml  Output 1350 ml  Net -250 ml     Assessment/Plan:  64 y.o. male is s/p TEVAR 4 Days Post-Op   All extremities well perfused with distal pulses intact L groin cath site without hematoma Aspirin started yesterday; ok to resume Eliquis from vascular standpoint Follow up with Dr. Myra Gianotti in 1 month with CTA c/a/p; office will arrange    Emilie Rutter, PA-C [facility] [phone] 12/15/2019 7:49 AM
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
## 2019-12-15 NOTE — Progress Notes (Signed)
   12/15/19 0740  Assess: MEWS Score  Temp 98.2 F (36.8 C)  BP (!) 149/80  Pulse Rate (!) 106  ECG Heart Rate (!) 118  Resp 19  Level of Consciousness Alert  SpO2 94 %  O2 Device Room Air  Patient Activity (if Appropriate) In bed  Assess: MEWS Score  MEWS Temp 0  MEWS Systolic 0  MEWS Pulse 2  MEWS RR 0  MEWS LOC 0  MEWS Score 2  MEWS Score Color Yellow  Assess: if the MEWS score is Yellow or Red  Were vital signs taken at a resting state? Yes  Focused Assessment Documented focused assessment  Early Detection of Sepsis Score *See Row Information* Low  MEWS guidelines implemented *See Row Information* No, previously yellow, continue vital signs every 4 hours  Treat  MEWS Interventions Other (Comment) (Assessed pt and notified proivider)  Notify: Provider  Provider Name/Title Leary Roca  Date Provider Notified 12/15/19  Time Provider Notified [phone]  Notification Type Page  Notification Reason Change in status  Response Other (Comment) (cardiology consult)  Date of Provider Response 12/15/19  Time of Provider Response 0800   Per night RN, patient went into Afib with RVR, he received Diltiazem 10mg  IV. HR remains in 113-129. Paged Trauma.
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
## 2019-12-16 LAB — GLUCOSE, CAPILLARY
Glucose-Capillary: 146 mg/dL — ABNORMAL HIGH (ref 70–99)
Glucose-Capillary: 137 mg/dL — ABNORMAL HIGH (ref 70–99)

## 2019-12-16 LAB — CBC
HCT: 28.8 % — ABNORMAL LOW (ref 39.0–52.0)
Hemoglobin: 8.8 g/dL — ABNORMAL LOW (ref 13.0–17.0)
MCH: 28.3 pg (ref 26.0–34.0)
MCHC: 30.6 g/dL (ref 30.0–36.0)
MCV: 92.6 fL (ref 80.0–100.0)
Platelets: 405 10*3/uL — ABNORMAL HIGH (ref 150–400)
RBC: 3.11 MIL/uL — ABNORMAL LOW (ref 4.22–5.81)
RDW: 14.9 % (ref 11.5–15.5)
WBC: 9.5 10*3/uL (ref 4.0–10.5)
nRBC: 0 % (ref 0.0–0.2)

## 2019-12-16 MED ORDER — POLYETHYLENE GLYCOL 3350 17 G PO PACK: 17.0000 g | PACK | Freq: Two times a day (BID) | ORAL | 0 refills | Status: DC | PRN

## 2019-12-16 MED ORDER — DOCUSATE SODIUM 100 MG PO CAPS: 100.0000 mg | ORAL_CAPSULE | Freq: Two times a day (BID) | ORAL | 0 refills | Status: DC | PRN

## 2019-12-16 MED ORDER — METHOCARBAMOL 500 MG PO TABS
500.0000 mg | ORAL_TABLET | Freq: Three times a day (TID) | ORAL | 0 refills | Status: DC | PRN
Start: 2019-12-16 — End: 2020-08-13

## 2019-12-16 MED ORDER — METOPROLOL SUCCINATE ER 100 MG PO TB24
100.0000 mg | ORAL_TABLET | Freq: Every day | ORAL | Status: DC
  Administered 2019-12-16: 100 mg via ORAL
  Filled 2019-12-16: qty 1

## 2019-12-16 MED ORDER — ACETAMINOPHEN 500 MG PO TABS: 1000.0000 mg | ORAL_TABLET | Freq: Three times a day (TID) | ORAL | 0 refills | Status: AC | PRN

## 2019-12-16 MED ORDER — ONDANSETRON 4 MG PO TBDP: 4.0000 mg | ORAL_TABLET | Freq: Four times a day (QID) | ORAL | 0 refills | Status: DC | PRN

## 2019-12-16 MED ORDER — OXYCODONE HCL 5 MG PO TABS: 5.0000 mg | ORAL_TABLET | Freq: Four times a day (QID) | ORAL | 0 refills | Status: DC | PRN

## 2019-12-16 MED ORDER — BISACODYL 10 MG RE SUPP
10.0000 mg | Freq: Once | RECTAL | Status: DC
  Filled 2019-12-16: qty 1

## 2019-12-16 MED ORDER — DILTIAZEM HCL ER COATED BEADS 240 MG PO CP24: 240.0000 mg | ORAL_CAPSULE | Freq: Every day | ORAL | 0 refills | Status: DC

## 2019-12-16 MED ORDER — METOPROLOL SUCCINATE ER 100 MG PO TB24: 100.0000 mg | ORAL_TABLET | Freq: Every day | ORAL | 0 refills | Status: DC
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
## 2019-12-16 NOTE — Discharge Instructions (Addendum)
[facility]   Discharge Instructions  Endovascular Aortic Aneurysm Repair  Please refer to the following instructions for your post-procedure care. Your surgeon or Physician Assistant will discuss any changes with you.  Activity  You are encouraged to walk as much as you can. You can slowly return to normal activities but must avoid strenuous activity and heavy lifting until your doctor tells you it's OK. Avoid activities such as vacuuming or swinging a gold club. It is normal to feel tired for several weeks after your surgery. Do not drive until your doctor gives the OK and you are no longer taking prescription pain medications. It is also normal to have difficulty with sleep habits, eating, and bowel movements after surgery. These will go away with time.  Bathing/Showering  You may shower after you go home. If you have an incision, do not soak in a bathtub, hot tub, or swim until the incision heals completely.  Incision Care  Shower every day. Clean your incision with mild soap and water. Pat the area dry with a clean towel. You do not need a bandage unless otherwise instructed. Do not apply any ointments or creams to your incision. If you clothing is irritating, you may cover your incision with a dry gauze pad.  Diet  Resume your normal diet. There are no special food restrictions following this procedure. A low fat/low cholesterol diet is recommended for all patients with vascular disease. In order to heal from your surgery, it is CRITICAL to get adequate nutrition. Your body requires vitamins, minerals, and protein. Vegetables are the best source of vitamins and minerals. Vegetables also provide the perfect balance of protein. Processed food has little nutritional value, so try to avoid this.  Medications  Resume taking all of your medications unless your doctor or nurse practitioner tells you not to. If your incision is causing pain, you may take  over-the-counter pain relievers such as acetaminophen (Tylenol). If you were prescribed a stronger pain medication, please be aware these medications can cause nausea and constipation. Prevent nausea by taking the medication with a snack or meal. Avoid constipation by drinking plenty of fluids and eating foods with a high amount of fiber, such as fruits, vegetables, and grains. Do not take Tylenol if you are taking prescription pain medications.   Follow up  Our office will schedule a follow-up appointment with a C.T. scan 3-4 weeks after your surgery.  Please call us immediately for any of the following conditions  Severe or worsening pain in your legs or feet or in your abdomen back or chest. Increased pain, redness, drainage (pus) from your incision sit. Increased abdominal pain, bloating, nausea, vomiting or persistent diarrhea. Fever of 101 degrees or higher. Swelling in your leg (s),  Reduce your risk of vascular disease  .Stop smoking. If you would like help call [facility] at 1-800-QUIT-NOW (917-433-7002) or [place] at 712-744-5524. .Manage your cholesterol .Maintain a desired weight .Control your diabetes .Keep your blood pressure down  If you have questions, please call the office at [phone].  Information on my medicine - ELIQUIS (apixaban)  This medication education was reviewed with me or my healthcare representative as part of my discharge preparation.  Why was Eliquis prescribed for you? Eliquis was prescribed for you to reduce the risk of a blood clot forming that can cause a stroke if you have a medical condition called atrial fibrillation (a type of irregular heartbeat).  What do You need to know  about Eliquis ? Take your Eliquis TWICE DAILY - one tablet in the morning and one tablet in the evening with or without food. If you have difficulty swallowing the tablet whole please discuss with your pharmacist how to take the medication safely.  Take  Eliquis exactly as prescribed by your doctor and DO NOT stop taking Eliquis without talking to the doctor who prescribed the medication.  Stopping may increase your risk of developing a stroke.  Refill your prescription before you run out.  After discharge, you should have regular check-up appointments with your healthcare provider that is prescribing your Eliquis.  In the future your dose may need to be changed if your kidney function or weight changes by a significant amount or as you get older.  What do you do if you miss a dose? If you miss a dose, take it as soon as you remember on the same day and resume taking twice daily.  Do not take more than one dose of ELIQUIS at the same time to make up a missed dose.  Important Safety Information A possible side effect of Eliquis is bleeding. You should call your healthcare provider right away if you experience any of the following: ? Bleeding from an injury or your nose that does not stop. ? Unusual colored urine (red or dark brown) or unusual colored stools (red or black). ? Unusual bruising for unknown reasons. ? A serious fall or if you hit your head (even if there is no bleeding).  Some medicines may interact with Eliquis and might increase your risk of bleeding or clotting while on Eliquis. To help avoid this, consult your healthcare provider or pharmacist prior to using any new prescription or non-prescription medications, including herbals, vitamins, non-steroidal anti-inflammatory drugs (NSAIDs) and supplements.  This website has more information on Eliquis (apixaban): http://www.eliquis.com/eliquis/home  Neurosurgery recommends wearing your brace when up moving around. It is okay to remove when sitting   How To Use a Sling A sling is a type of hanging bandage that is worn around the neck to protect an injured arm, shoulder, or other body part. A sling may be needed to prevent movement of (to immobilize) the injured body part while  it heals. Keeping the injured body part still can lessen pain and speed up healing. A health care provider may recommend using a sling:  To treat a broken arm or collarbone.  To treat a shoulder injury.  After surgery. What are the risks? In general, wearing a sling helps with safe healing. However, in some cases, wearing a sling the wrong way can:  Make the injury worse.  Cause stiffness or numbness.  Affect blood flow (circulation) in the arm and hand. This can cause tingling or numbness in the fingers or hands. How to use a sling Follow instructions from your health care provider about how and when to wear your sling. Your health care provider will show you or tell you:  How to put on the sling.  How to adjust the sling.  When and how often to wear the sling.  How to remove the sling. The way that you need to use a sling depends on your injury. Unless your health care provider gives you different instructions, you should:  Wear the sling so that your elbow bends to the shape of a capital letter "L" (90 degrees, or a right angle).  Make sure the sling supports your wrist and your hand.  Adjust the sling if your fingers or  hand start to tingle or feel numb. Follow these instructions at home:  Try to avoid moving your arm.  Do not twist, lift, or move your arm in a way that could make your injury worse.  Do not lean on your arm while wearing a sling.  Do not lift anything with the hand or arm that is in the sling. Contact a health care provider if:  You have bruising, swelling, or pain that gets worse.  You have pain that does not get better with medicine.  You have a fever.  Your sling is not supporting your arm properly.  Your sling gets damaged. Get help right away if you:  Have numbness or tingling in your fingers.  Notice that your fingers turn blue or feel cold to the touch.  Cannot control bleeding from your injury.  Have shortness of  breath. Summary  A sling is a type of hanging bandage that is worn around the neck to protect an injured arm, shoulder, or other body part. A sling may be needed to prevent movement of (to immobilize) the injured body part while it heals.  The way that you use a sling depends on your injury. Carefully follow instructions from your health care provider.  A good general rule is to wear the sling so that your arms bends 90 degrees (at a right angle) at the elbow. That is like the shape of a capital letter "L."  Make sure you know which problems should cause you to contact your health care provider or get help right away. This information is not intended to replace advice given to you by your health care provider. Make sure you discuss any questions you have with your health care provider. Document Revised: 05/01/2017 Document Reviewed: 04/09/2017 Elsevier Patient Education  2020 Elsevier Inc.   Scapular Fracture  A scapular fracture is a break in the large, triangular bone behind your shoulder (shoulder blade or scapula). This bone makes up the socket joint of your shoulder. The scapula is well protected by muscles, so scapular fractures are unusual injuries. They often involve a lot of force. People who have a scapular fracture often have other injuries as well. These may be injuries to the lung, spine, head, shoulder, or ribs. What are the causes? Common causes of this condition include:  A fall from a great height.  A car or motorcycle accident.  A heavy, direct blow to the scapula. What are the signs or symptoms? The main symptom of a scapular fracture is severe pain when you try to move your arm. Other signs and symptoms include:  Swelling behind the shoulder.  Bruising.  Holding the arm still and close to the body. How is this diagnosed? This condition may be diagnosed based on:  Your symptoms and the details of a recent injury.  A physical exam.  X-ray or CT scan to  confirm the diagnosis and to check for other injuries. How is this treated? This condition may be treated with:  Immobilization. Your arm is put in a sling. A support bandage may be wrapped around your chest. The health care provider will explain how to move your shoulder for the first week after your injury in order to prevent pain and stiffness. The sling can be removed as your movement increases and your pain decreases.  Physical therapy. A physical therapist will teach you exercises to stretch and strengthen your shoulder. The goal is to keep your shoulder from getting stiff or frozen. You may  need to do these exercises for 6-12 months.  Surgery. You may need surgery if the bone pieces are out of place (displaced fracture). You may also need surgery if the fracture causes the bone to be deformed. In this case, the broken scapula will be put back into position and held in place with a surgical plate and screws. Surgery is rarely done for this condition. Follow these instructions at home:  Medicines  Take over-the-counter and prescription medicines only as told by your health care provider.  Do not drive or use heavy machinery while taking prescription pain medicine. If you have a splint and a wrap:  Wear the splint and the wrap as told by your health care provider. Remove them only as told by your health care provider.  Loosen them if your fingers or toes tingle, become numb, or turn cold and blue.  Keep them clean.  If they are not waterproof: ? Do not let them get wet. ? Cover them with a watertight covering when you take a bath or a shower. Managing pain, stiffness, and swelling  Apply ice to the back of your shoulder: ? If you have a removable splint or wrap, remove it as told by your health care provider. ? Put ice in a plastic bag. ? Place a towel between your skin and the bag. ? Leave the ice on for 20 minutes, 2-3 times per day.  Do not lift anything that is heavier than  10 lbs. (4.5 kg), or the limit that your health care provider tells you, until he or she says that it is safe.  Avoid activities that make your symptoms worse for 4-6 weeks, or as long as directed. General instructions  Ask your health care provider when it is safe for you to drive.  Do not use any products that contain nicotine or tobacco, such as cigarettes and e-cigarettes. These can delay bone healing. If you need help quitting, ask your health care provider.  Drink enough fluid to keep your urine pale yellow.  Do physical therapy exercises as told by your health care provider.  Return to your normal activities as told by your health care provider. Ask your health care provider what activities are safe for you.  Keep all follow-up visits as told by your health care provider. This is important. Contact a health care provider if:  You have pain that is not relieved by medicine.  You are unable to do your physical therapy because of pain or stiffness. Get help right away if:  You are short of breath.  You cough up blood.  You cannot move your arm or your fingers. Summary  A scapular fracture is a break in the large, triangular bone behind your shoulder (shoulder blade or scapula).  The scapula is well protected by muscles, so scapular fractures are unusual injuries. They often involve a lot of force.  The main symptom of a scapular fracture is severe pain when you try to move your arm.  Immobilization, physical therapy, and surgery are used to treat this injury. Surgery is rarely done.  Follow your health care provider's instructions on taking medicines, using a wrap and splint, putting ice on the injured area, and resting from regular activities. This information is not intended to replace advice given to you by your health care provider. Make sure you discuss any questions you have with your health care provider. Document Revised: 07/31/2017 Document Reviewed:  06/30/2017 Elsevier Patient Education  2020 [employer].  RIB FRACTURES  HOME INSTRUCTIONS   1. PAIN CONTROL:  1. Pain is best controlled by a usual combination of three different methods TOGETHER:  i. Ice/Heat ii. Over the counter pain medication iii. Prescription pain medication 2. You may experience some swelling and bruising in area of broken ribs. Ice packs or heating pads (30-60 minutes up to 6 times a day) will help. Use ice for the first few days to help decrease swelling and bruising, then switch to heat to help relax tight/sore spots and speed recovery. Some people prefer to use ice alone, heat alone, alternating between ice & heat. Experiment to what works for you. Swelling and bruising can take several weeks to resolve.  3. It is helpful to take an over-the-counter pain medication regularly for the first few weeks. Choose one of the following that works best for you:  i. Naproxen (Aleve, etc) Two 220mg  tabs twice a day ii. Ibuprofen (Advil, etc) Three 200mg  tabs four times a day (every meal & bedtime) iii. Acetaminophen (Tylenol, etc) 500-650mg  four times a day (every meal & bedtime) 4. A prescription for pain medication (such as oxycodone, hydrocodone, etc) may be given to you upon discharge. Take your pain medication as prescribed.  i. If you are having problems/concerns with the prescription medicine (does not control pain, nausea, vomiting, rash, itching, etc), please call [phone] to see if we need to switch you to a different pain medicine that will work better for you and/or control your side effect better. ii. If you need a refill on your pain medication, please contact your pharmacy. They will contact our office to request authorization. Prescriptions will not be filled after 5 pm or on week-ends. 1. Avoid getting constipated. When taking pain medications, it is common to experience some constipation. Increasing fluid intake and taking a fiber supplement (such as  Metamucil, Citrucel, FiberCon, MiraLax, etc) 1-2 times a day regularly will usually help prevent this problem from occurring. A mild laxative (prune juice, Milk of Magnesia, MiraLax, etc) should be taken according to package directions if there are no bowel movements after 48 hours.  2. Watch out for diarrhea. If you have many loose bowel movements, simplify your diet to bland foods & liquids for a few days. Stop any stool softeners and decrease your fiber supplement. Switching to mild anti-diarrheal medications (Kayopectate, Pepto Bismol) can help. If this worsens or does not improve, please call us. 3. FOLLOW UP  a. If a follow up appointment is needed one will be scheduled for you. If none is needed with our trauma team, please follow up with your primary care provider within 2-3 weeks from discharge. Please call [facility] at [phone] if you have any questions about follow up.  b. If you have any orthopedic or other injuries you will need to follow up as outlined in your follow up instructions.   WHEN TO CALL us [phone]:  1. Poor pain control 2. Reactions / problems with new medications (rash/itching, nausea, etc)  3. Fever over 101.5 F (38.5 C) 4. Worsening swelling or bruising 5. Worsening pain, productive cough, difficulty breathing or any other concerning symptoms  The clinic staff is available to answer your questions during regular business hours (8:30am-5pm). Please don't hesitate to call and ask to speak to one of our nurses for clinical concerns.  If you have a medical emergency, go to the nearest emergency room or call 911.  A surgeon from [facility] is always on call  at the [facility], [place]  [address], [address], [place], [place] [address] ?  MAIN: [phone] ? TOLL FREE: [phone] ?  FAX [phone]  [email or web address]      Information on Rib Fractures  A rib fracture is a break or crack in one  of the bones of the ribs. The ribs are long, curved bones that wrap around your chest and attach to your spine and your breastbone. The ribs protect your heart, lungs, and other organs in the chest. A broken or cracked rib is often painful but is not usually serious. Most rib fractures heal on their own over time. However, rib fractures can be more serious if multiple ribs are broken or if broken ribs move out of place and push against other structures or organs. What are the causes? This condition is caused by:  Repetitive movements with high force, such as pitching a baseball or having severe coughing spells.  A direct blow to the chest, such as a sports injury, a car accident, or a fall.  Cancer that has spread to the bones, which can weaken bones and cause them to break. What are the signs or symptoms? Symptoms of this condition include:  Pain when you breathe in or cough.  Pain when someone presses on the injured area.  Feeling short of breath. How is this diagnosed? This condition is diagnosed with a physical exam and medical history. Imaging tests may also be done, such as:  Chest X-ray.  CT scan.  MRI.  Bone scan.  Chest ultrasound. How is this treated? Treatment for this condition depends on the severity of the fracture. Most rib fractures usually heal on their own in 1-3 months. Sometimes healing takes longer if there is a cough that does not stop or if there are other activities that make the injury worse (aggravating factors). While you heal, you will be given medicines to control the pain. You will also be taught deep breathing exercises. Severe injuries may require hospitalization or surgery. Follow these instructions at home: Managing pain, stiffness, and swelling  If directed, apply ice to the injured area. ? Put ice in a plastic bag. ? Place a towel between your skin and the bag. ? Leave the ice on for 20 minutes, 2-3 times a day.  Take over-the-counter and  prescription medicines only as told by your health care provider. Activity  Avoid a lot of activity and any activities or movements that cause pain. Be careful during activities and avoid bumping the injured rib.  Slowly increase your activity as told by your health care provider. General instructions  Do deep breathing exercises as told by your health care provider. This helps prevent pneumonia, which is a common complication of a broken rib. Your health care provider may instruct you to: ? Take deep breaths several times a day. ? Try to cough several times a day, holding a pillow against the injured area. ? Use a device called incentive spirometer to practice deep breathing several times a day.  Drink enough fluid to keep your urine pale yellow.  Do not wear a rib belt or binder. These restrict breathing, which can lead to pneumonia.  Keep all follow-up visits as told by your health care provider. This is important. Contact a health care provider if:  You have a fever. Get help right away if:  You have difficulty breathing or you are short of breath.  You develop a cough that does  not stop, or you cough up thick or bloody sputum.  You have nausea, vomiting, or pain in your abdomen.  Your pain gets worse and medicine does not help. Summary  A rib fracture is a break or crack in one of the bones of the ribs.  A broken or cracked rib is often painful but is not usually serious.  Most rib fractures heal on their own over time.  Treatment for this condition depends on the severity of the fracture.  Avoid a lot of activity and any activities or movements that cause pain. This information is not intended to replace advice given to you by your health care provider. Make sure you discuss any questions you have with your health care provider. Document Released: 05/19/2005 Document Revised: 08/18/2016 Document Reviewed: 08/18/2016 Elsevier Interactive Patient Education  2019  [employer]

## 2019-12-16 NOTE — Progress Notes (Signed)
At 1600  Discharge instructions reviewed with pt and his sister. Copy of instructions given to pt. Scripts were sent in electronically to pt's pharmacy, pt informed, also instructed on getting other meds listed over the counter (no script for). Pt to get dressed and then be ready for discharge.    At 1730     Pt d/c'd via wheelchair with belongings, with pt's sister. (pt's wife was discharged from hospital earlier today)            Escorted by unit staff.

## 2019-12-16 NOTE — TOC Transition Note (Signed)
Transition of Care [facility]) - CM/SW Discharge Note   Patient Details  Name: Mark Molina MRN: [id number] Date of Birth: 08/19/1955  [facility] of [facility]) CM/SW Contact:  Glennon Mac, RN Phone Number: 12/16/2019, 3:19 PM   Clinical Narrative: Patient medically stable for discharge home today with family and home health services as arranged.  Will notify home health agency with patient discharge date.  No DME needed.  Final next level of care: Home w Home Health Services Barriers to Discharge: No Barriers Identified   Patient Goals and CMS Choice Patient states their goals for this hospitalization and ongoing recovery are:: Go home   Choice offered to / list presented to : Patient  Discharge Placement                       Discharge Plan and Services   Discharge Planning Services: CM Consult Post Acute Care Choice: Durable Medical Equipment          DME Arranged: 3-N-1 DME Agency: [facility] Date DME Agency Contacted: 12/15/19 Time DME Agency Contacted: 1325 Representative spoke with at DME Agency: zach Blank HH Arranged: PT, OT HH Agency: [facility] Date [facility] Agency Contacted: 12/15/19 Time HH Agency Contacted: 1343 Representative spoke with at [facility] Agency: Bonnye Fava  Social Determinants of Health (SDOH) Interventions     Readmission Risk Interventions No flowsheet data found.  Quintella Baton, RN, BSN  Trauma/Neuro ICU Case Manager [phone]
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
## 2019-12-16 NOTE — Progress Notes (Signed)
  NEUROSURGERY PROGRESS NOTE   Minimal back pain No new N/T/W in extremities Able to ambulate with minimal difficulties  EXAM:  BP 120/62 (BP Location: Left Arm)   Pulse (!) 103   Temp 98 F (36.7 C) (Oral)   Resp 19   Ht 6' (1.829 m)   Wt 116.1 kg   SpO2 97%   BMI 34.71 kg/m   Awake, alert, oriented  Speech fluent, appropriate  CN grossly intact  5/5 BLE  Incision: dried blood  IMPRESSION/PLAN 64 y.o. male POD4 thoracic fusion for unstable Tspine fracture. Doing well. Unchanged neurologically with restarting of Eliqis.  - no new NS recs - cleared for d/c from NS perspective. Plan to f/u outpatient in 3 weeks - brace when up moving around. Okay to remove when sitting
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
## 2019-12-19 ENCOUNTER — Other Ambulatory Visit: Payer: Self-pay

## 2019-12-19 DIAGNOSIS — I712 Thoracic aortic aneurysm, without rupture, unspecified: Secondary | ICD-10-CM

## 2019-12-23 ENCOUNTER — Ambulatory Visit (INDEPENDENT_AMBULATORY_CARE_PROVIDER_SITE_OTHER): Payer: Medicare Other | Admitting: Orthopedic Surgery

## 2019-12-23 ENCOUNTER — Ambulatory Visit (INDEPENDENT_AMBULATORY_CARE_PROVIDER_SITE_OTHER): Payer: Medicare Other

## 2019-12-23 ENCOUNTER — Other Ambulatory Visit: Payer: Self-pay | Admitting: Neurosurgery

## 2019-12-23 DIAGNOSIS — S42101A Fracture of unspecified part of scapula, right shoulder, initial encounter for closed fracture: Secondary | ICD-10-CM

## 2019-12-24 ENCOUNTER — Encounter: Payer: Self-pay | Admitting: Orthopedic Surgery
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
## 2020-01-11 ENCOUNTER — Other Ambulatory Visit: Payer: Medicare Other

## 2020-01-16 ENCOUNTER — Encounter: Payer: Medicare Other | Admitting: Surgery

## 2020-01-19 ENCOUNTER — Ambulatory Visit (INDEPENDENT_AMBULATORY_CARE_PROVIDER_SITE_OTHER): Payer: Medicare Other | Admitting: Orthopedic Surgery

## 2020-01-19 ENCOUNTER — Ambulatory Visit: Payer: Self-pay

## 2020-01-19 DIAGNOSIS — S42101A Fracture of unspecified part of scapula, right shoulder, initial encounter for closed fracture: Secondary | ICD-10-CM

## 2020-01-19 MED ORDER — HYDROCODONE-ACETAMINOPHEN 5-325 MG PO TABS: 1.0000 | ORAL_TABLET | Freq: Every evening | ORAL | 0 refills | Status: DC | PRN

## 2020-01-21 ENCOUNTER — Encounter: Payer: Self-pay | Admitting: Orthopedic Surgery
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
## 2020-01-24 ENCOUNTER — Ambulatory Visit
Admission: RE | Admit: 2020-01-24 | Discharge: 2020-01-24 | Disposition: A | Discharge status: Home / Self Care | Payer: Medicare Other | Source: Ambulatory Visit | Attending: Surgery | Admitting: Surgery

## 2020-01-24 ENCOUNTER — Other Ambulatory Visit: Payer: Self-pay

## 2020-01-24 DIAGNOSIS — I712 Thoracic aortic aneurysm, without rupture, unspecified: Secondary | ICD-10-CM

## 2020-01-24 MED ORDER — IOPAMIDOL (ISOVUE-370) INJECTION 76%
75.0000 mL | Freq: Once | INTRAVENOUS | Status: AC | PRN
  Administered 2020-01-24: 75 mL via INTRAVENOUS

## 2020-01-30 ENCOUNTER — Encounter: Payer: Self-pay | Admitting: Surgery

## 2020-01-30 ENCOUNTER — Other Ambulatory Visit: Payer: Self-pay

## 2020-01-30 ENCOUNTER — Ambulatory Visit (INDEPENDENT_AMBULATORY_CARE_PROVIDER_SITE_OTHER): Payer: Medicare Other | Admitting: Surgery

## 2020-01-30 VITALS — BP 133/77 | HR 71 | Temp 97.9°F | Resp 20 | Ht 72.0 in | Wt 230.0 lb

## 2020-01-30 DIAGNOSIS — I712 Thoracic aortic aneurysm, without rupture, unspecified: Secondary | ICD-10-CM
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
## 2020-07-11 ENCOUNTER — Other Ambulatory Visit: Payer: Self-pay | Admitting: *Deleted

## 2020-07-11 DIAGNOSIS — I712 Thoracic aortic aneurysm, without rupture, unspecified: Secondary | ICD-10-CM

## 2020-08-09 ENCOUNTER — Ambulatory Visit
Admission: RE | Admit: 2020-08-09 | Discharge: 2020-08-09 | Disposition: A | Discharge status: Home / Self Care | Payer: Medicare Other | Source: Ambulatory Visit | Attending: Surgery | Admitting: Surgery

## 2020-08-09 DIAGNOSIS — I712 Thoracic aortic aneurysm, without rupture, unspecified: Secondary | ICD-10-CM

## 2020-08-09 MED ORDER — IOPAMIDOL (ISOVUE-370) INJECTION 76%
75.0000 mL | Freq: Once | INTRAVENOUS | Status: AC | PRN
  Administered 2020-08-09: 75 mL via INTRAVENOUS

## 2020-08-13 ENCOUNTER — Encounter: Payer: Self-pay | Admitting: Surgery

## 2020-08-13 ENCOUNTER — Other Ambulatory Visit: Payer: Self-pay

## 2020-08-13 ENCOUNTER — Ambulatory Visit (INDEPENDENT_AMBULATORY_CARE_PROVIDER_SITE_OTHER): Payer: Medicare Other | Admitting: Surgery

## 2020-08-13 VITALS — BP 153/81 | HR 64 | Temp 98.2°F | Resp 20 | Ht 72.0 in | Wt 262.0 lb

## 2020-08-13 DIAGNOSIS — I712 Thoracic aortic aneurysm, without rupture, unspecified: Secondary | ICD-10-CM
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
